# Patient Record
Sex: Female | Born: 1980 | Race: White | Hispanic: No | Marital: Married | Smoking: Never smoker
Health system: Southern US, Community
[De-identification: ages and names within clinical notes are randomized; demographics above are authoritative.]

## PROBLEM LIST (undated history)

## (undated) DIAGNOSIS — F329 Major depressive disorder, single episode, unspecified: Secondary | ICD-10-CM

## (undated) DIAGNOSIS — F419 Anxiety disorder, unspecified: Secondary | ICD-10-CM

## (undated) DIAGNOSIS — K219 Gastro-esophageal reflux disease without esophagitis: Secondary | ICD-10-CM

## (undated) DIAGNOSIS — F32A Depression, unspecified: Secondary | ICD-10-CM

## (undated) DIAGNOSIS — I10 Essential (primary) hypertension: Secondary | ICD-10-CM

## (undated) HISTORY — DX: Essential (primary) hypertension: I10

## (undated) HISTORY — PX: GASTRIC BYPASS: SHX52

## (undated) HISTORY — PX: APPENDECTOMY: SHX54

## (undated) HISTORY — DX: Depression, unspecified: F32.A

---
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
## 2009-06-08 ENCOUNTER — Inpatient Hospital Stay: Payer: Self-pay | Admitting: Vascular Surgery

## 2014-03-01 ENCOUNTER — Encounter: Payer: Self-pay | Admitting: Family Medicine

## 2014-03-01 ENCOUNTER — Ambulatory Visit (INDEPENDENT_AMBULATORY_CARE_PROVIDER_SITE_OTHER): Payer: No Typology Code available for payment source | Admitting: Family Medicine

## 2014-03-01 VITALS — BP 154/96 | HR 95 | Temp 98.5°F | Resp 18 | Ht 67.5 in | Wt 321.8 lb

## 2014-03-01 DIAGNOSIS — R030 Elevated blood-pressure reading, without diagnosis of hypertension: Secondary | ICD-10-CM

## 2014-03-01 DIAGNOSIS — R5381 Other malaise: Secondary | ICD-10-CM

## 2014-03-01 DIAGNOSIS — R635 Abnormal weight gain: Secondary | ICD-10-CM

## 2014-03-01 DIAGNOSIS — E669 Obesity, unspecified: Secondary | ICD-10-CM

## 2014-03-01 DIAGNOSIS — R5383 Other fatigue: Principal | ICD-10-CM

## 2014-03-01 LAB — LIPID PANEL
Cholesterol: 168 mg/dL (ref 0–200)
HDL: 46 mg/dL (ref >39)
LDL Cholesterol: 98 mg/dL (ref 0–99)
Total CHOL/HDL Ratio: 3.7 Ratio
Triglycerides: 119 mg/dL (ref <150)
VLDL: 24 mg/dL (ref 0–40)

## 2014-03-01 LAB — COMPLETE METABOLIC PANEL WITH GFR
ALK PHOS: 107 U/L (ref 39–117)
ALT: 24 U/L (ref 0–35)
AST: 21 U/L (ref 0–37)
Albumin: 4 g/dL (ref 3.5–5.2)
BILIRUBIN TOTAL: 0.4 mg/dL (ref 0.2–1.2)
BUN: 12 mg/dL (ref 6–23)
CO2: 25 meq/L (ref 19–32)
CREATININE: 0.7 mg/dL (ref 0.50–1.10)
Calcium: 8.8 mg/dL (ref 8.4–10.5)
Chloride: 105 mEq/L (ref 96–112)
GFR, Est African American: >89 mL/min
GFR, Est Non African American: >89 mL/min
Glucose, Bld: 95 mg/dL (ref 70–99)
Potassium: 4.4 mEq/L (ref 3.5–5.3)
Sodium: 139 mEq/L (ref 135–145)
Total Protein: 7.1 g/dL (ref 6.0–8.3)

## 2014-03-01 LAB — POCT GLYCOSYLATED HEMOGLOBIN (HGB A1C): Hemoglobin A1C: 5

## 2014-03-01 LAB — CBC WITH DIFFERENTIAL/PLATELET
Basophils Absolute: 0 10*3/uL (ref 0.0–0.1)
Basophils Relative: 0 % (ref 0–1)
EOS ABS: 0.1 10*3/uL (ref 0.0–0.7)
Eosinophils Relative: 1 % (ref 0–5)
HCT: 42.3 % (ref 36.0–46.0)
Hemoglobin: 14.6 g/dL (ref 12.0–15.0)
LYMPHS ABS: 1.8 10*3/uL (ref 0.7–4.0)
LYMPHS PCT: 23 % (ref 12–46)
MCH: 30.5 pg (ref 26.0–34.0)
MCHC: 34.5 g/dL (ref 30.0–36.0)
MCV: 88.3 fL (ref 78.0–100.0)
Monocytes Absolute: 0.8 10*3/uL (ref 0.1–1.0)
Monocytes Relative: 10 % (ref 3–12)
Neutro Abs: 5.3 10*3/uL (ref 1.7–7.7)
Neutrophils Relative %: 66 % (ref 43–77)
PLATELETS: 435 10*3/uL — AB (ref 150–400)
RBC: 4.79 MIL/uL (ref 3.87–5.11)
RDW: 13.1 % (ref 11.5–15.5)
WBC: 8 10*3/uL (ref 4.0–10.5)

## 2014-03-01 LAB — GLUCOSE, POCT (MANUAL RESULT ENTRY): POC GLUCOSE: 98 mg/dL (ref 70–99)

## 2014-03-01 NOTE — Patient Instructions (Signed)
Fatigue Fatigue is a feeling of tiredness, lack of energy, lack of motivation, or feeling tired all the time. Having enough rest, good nutrition, and reducing stress will normally reduce fatigue. Consult your caregiver if it persists. The nature of your fatigue will help your caregiver to find out its cause. The treatment is based on the cause.  CAUSES  There are many causes for fatigue. Most of the time, fatigue can be traced to one or more of your habits or routines. Most causes fit into one or more of three general areas. They are: Lifestyle problems  Sleep disturbances.  Overwork.  Physical exertion.  Unhealthy habits.  Poor eating habits or eating disorders.  Alcohol and/or drug use .  Lack of proper nutrition (malnutrition). Psychological problems  Stress and/or anxiety problems.  Depression.  Grief.  Boredom. Medical Problems or Conditions  Anemia.  Pregnancy.  Thyroid gland problems.  Recovery from major surgery.  Continuous pain.  Emphysema or asthma that is not well controlled  Allergic conditions.  Diabetes.  Infections (such as mononucleosis).  Obesity.  Sleep disorders, such as sleep apnea.  Heart failure or other heart-related problems.  Cancer.  Kidney disease.  Liver disease.  Effects of certain medicines such as antihistamines, cough and cold remedies, prescription pain medicines, heart and blood pressure medicines, drugs used for treatment of cancer, and some antidepressants. SYMPTOMS  The symptoms of fatigue include:   Lack of energy.  Lack of drive (motivation).  Drowsiness.  Feeling of indifference to the surroundings. DIAGNOSIS  The details of how you feel help guide your caregiver in finding out what is causing the fatigue. You will be asked about your present and past health condition. It is important to review all medicines that you take, including prescription and non-prescription items. A thorough exam will be done.  You will be questioned about your feelings, habits, and normal lifestyle. Your caregiver may suggest blood tests, urine tests, or other tests to look for common medical causes of fatigue.  TREATMENT  Fatigue is treated by correcting the underlying cause. For example, if you have continuous pain or depression, treating these causes will improve how you feel. Similarly, adjusting the dose of certain medicines will help in reducing fatigue.  HOME CARE INSTRUCTIONS   Try to get the required amount of good sleep every night.  Eat a healthy and nutritious diet, and drink enough water throughout the day.  Practice ways of relaxing (including yoga or meditation).  Exercise regularly.  Make plans to change situations that cause stress. Act on those plans so that stresses decrease over time. Keep your work and personal routine reasonable.  Avoid street drugs and minimize use of alcohol.  Start taking a daily multivitamin after consulting your caregiver. SEEK MEDICAL CARE IF:   You have persistent tiredness, which cannot be accounted for.  You have fever.  You have unintentional weight loss.  You have headaches.  You have disturbed sleep throughout the night.  You are feeling sad.  You have constipation.  You have dry skin.  You have gained weight.  You are taking any new or different medicines that you suspect are causing fatigue.  You are unable to sleep at night.  You develop any unusual swelling of your legs or other parts of your body. SEEK IMMEDIATE MEDICAL CARE IF:   You are feeling confused.  Your vision is blurred.  You feel faint or pass out.  You develop severe headache.  You develop severe abdominal, pelvic, or   back pain.  You develop chest pain, shortness of breath, or an irregular or fast heartbeat.  You are unable to pass a normal amount of urine.  You develop abnormal bleeding such as bleeding from the rectum or you vomit blood.  You have thoughts  about harming yourself or committing suicide.  You are worried that you might harm someone else. MAKE SURE YOU:   Understand these instructions.  Will watch your condition.  Will get help right away if you are not doing well or get worse. Document Released: 09/21/2007 Document Revised: 02/16/2012 Document Reviewed: 09/21/2007 [facility] Patient Information 2014 [place].    Vitamin D Deficiency Vitamin D is an important vitamin that your body needs. Having too little of it in your body is called a deficiency. A very bad deficiency can make your bones soft and can cause a condition called rickets.  Vitamin D is important to your body for different reasons, such as:   It helps your body absorb 2 minerals called calcium and phosphorus.  It helps make your bones healthy.  It may prevent some diseases, such as diabetes and multiple sclerosis.  It helps your muscles and heart. You can get vitamin D in several ways. It is a natural part of some foods. The vitamin is also added to some dairy products and cereals. Some people take vitamin D supplements. Also, your body makes vitamin D when you are in the sun. It changes the sun's rays into a form of the vitamin that your body can use. CAUSES   Not eating enough foods that contain vitamin D.  Not getting enough sunlight.  Having certain digestive system diseases that make it hard to absorb vitamin D. These diseases include Crohn's disease, chronic pancreatitis, and cystic fibrosis.  Having a surgery in which part of the stomach or small intestine is removed.  Being obese. Fat cells pull vitamin D out of your blood. That means that obese people may not have enough vitamin D left in their blood and in other body tissues.  Having chronic kidney or liver disease. RISK FACTORS Risk factors are things that make you more likely to develop a vitamin D deficiency. They include:  Being older.  Not being able to get outside very  much.  Living in a nursing home.  Having had broken bones.  Having weak or thin bones (osteoporosis).  Having a disease or condition that changes how your body absorbs vitamin D.  Having dark skin.  Some medicines such as seizure medicines or steroids.  Being overweight or obese. SYMPTOMS Mild cases of vitamin D deficiency may not have any symptoms. If you have a very bad case, symptoms may include:  Bone pain.  Muscle pain.  Falling often.  Broken bones caused by a minor injury, due to osteoporosis. DIAGNOSIS A blood test is the best way to tell if you have a vitamin D deficiency. TREATMENT Vitamin D deficiency can be treated in different ways. Treatment for vitamin D deficiency depends on what is causing it. Options include:  Taking vitamin D supplements.  Taking a calcium supplement. Your caregiver will suggest what dose is best for you. HOME CARE INSTRUCTIONS  Take any supplements that your caregiver prescribes. Follow the directions carefully. Take only the suggested amount.  Have your blood tested 2 months after you start taking supplements.  Eat foods that contain vitamin D. Healthy choices include:  Fortified dairy products, cereals, or juices. Fortified means vitamin D has been added to the food.  Check the label on the package to be sure.  Fatty fish like salmon or trout.  Eggs.  Oysters.  Do not use a tanning bed.  Keep your weight at a healthy level. Lose weight if you need to.  Keep all follow-up appointments. Your caregiver will need to perform blood tests to make sure your vitamin D deficiency is going away. SEEK MEDICAL CARE IF:  You have any questions about your treatment.  You continue to have symptoms of vitamin D deficiency.  You have nausea or vomiting.  You are constipated.  You feel confused.  You have severe abdominal or back pain. MAKE SURE YOU:  Understand these instructions.  Will watch your condition.  Will get help  right away if you are not doing well or get worse. Document Released: 02/16/2012 Document Revised: 03/21/2013 Document Reviewed: 02/16/2012 [facility] Patient Information 2014 [place].

## 2014-03-02 DIAGNOSIS — R030 Elevated blood-pressure reading, without diagnosis of hypertension: Secondary | ICD-10-CM | POA: Insufficient documentation

## 2014-03-02 DIAGNOSIS — E669 Obesity, unspecified: Secondary | ICD-10-CM | POA: Insufficient documentation

## 2014-03-02 DIAGNOSIS — R5383 Other fatigue: Principal | ICD-10-CM

## 2014-03-02 DIAGNOSIS — R5381 Other malaise: Secondary | ICD-10-CM | POA: Insufficient documentation

## 2014-03-02 LAB — THYROID PANEL WITH TSH
Free Thyroxine Index: 3.2 (ref 1.0–3.9)
T3 Uptake: 36.7 % (ref 22.5–37.0)
T4 TOTAL: 8.7 ug/dL (ref 5.0–12.5)
TSH: 1.731 u[IU]/mL (ref 0.350–4.500)

## 2014-03-02 LAB — ANA: Anti Nuclear Antibody(ANA): NEGATIVE

## 2014-03-02 LAB — VITAMIN D 25 HYDROXY (VIT D DEFICIENCY, FRACTURES): VIT D 25 HYDROXY: 19 ng/mL — AB (ref 30–89)

## 2014-03-02 NOTE — Progress Notes (Signed)
Subjective:    Patient ID: Alexandra Petty, female    DOB: 08-04-1981, 33 y.o.   MRN: [id number]  Rash Associated symptoms include shortness of breath. Pertinent negatives include no cough, fatigue or fever.    This 33 y.o. Cauc female is new to [facility], having received previous medical care in [place], [place]. She reports no chronic medical issues.  Her concerns today include weight gain (100 lbs in 18 months), fatigue x 6 months with occasional SOB/ DOE (stair climbing and walking > 1 mile) and muscle fatigue, onset in late evening. She has a facial rash w/ raised bumps in cheek and flushing in neck area x 1 year. Skin used to oily now it is very sensitive. She sleeps well and does not snore. She has had BP elevations in the past but no treatment for HTN.  PMHx, Surg Hx, Soc and Fam Hx reviewed.  Review of Systems  Constitutional: Positive for activity change and unexpected weight change. Negative for fever, diaphoresis, appetite change and fatigue.  HENT: Negative.   Eyes: Negative.   Respiratory: Positive for shortness of breath. Negative for cough, chest tightness and wheezing.   Cardiovascular: Negative.   Gastrointestinal: Negative.   Musculoskeletal: Positive for myalgias. Negative for arthralgias, gait problem and joint swelling.  Skin: Positive for color change and rash.  Neurological: Negative.   Hematological: Negative.   Psychiatric/Behavioral: Negative.       Objective:   Physical Exam  Nursing note and vitals reviewed. Constitutional: She is oriented to person, place, and time. She appears well-developed and well-nourished. No distress.  HENT:  Head: Normocephalic and atraumatic.  Right Ear: Hearing, tympanic membrane, external ear and ear canal normal.  Left Ear: Hearing, tympanic membrane, external ear and ear canal normal.  Nose: Nose normal. No nasal deformity or septal deviation.  Mouth/Throat: Uvula is midline, oropharynx is clear and moist and mucous membranes  are normal. No oral lesions. Normal dentition. No dental caries.  Eyes: Conjunctivae, EOM and lids are normal. Pupils are equal, round, and reactive to light. No scleral icterus.  Fundoscopic exam:      The right eye shows no hemorrhage and no papilledema. The right eye shows red reflex.       The left eye shows no hemorrhage and no papilledema. The left eye shows red reflex.  Neck: Trachea normal, normal range of motion and full passive range of motion without pain. Neck supple. No JVD present. No spinous process tenderness and no muscular tenderness present. Thyromegaly present. No mass present.  Cardiovascular: Normal rate, regular rhythm, S1 normal, S2 normal, normal heart sounds and normal pulses.   No extrasystoles are present. PMI is not displaced.  Exam reveals no gallop and no friction rub.   No murmur heard. Pulmonary/Chest: Effort normal and breath sounds normal. No respiratory distress.  Abdominal: Soft. Normal appearance and bowel sounds are normal. She exhibits no distension and no mass. There is no hepatosplenomegaly. There is no tenderness. There is no guarding and no CVA tenderness.  Increased abdominal girth makes assessment difficult.  Musculoskeletal: Normal range of motion. She exhibits no edema and no tenderness.  Lymphadenopathy:       Head (right side): No submental, no submandibular, no tonsillar, no posterior auricular and no occipital adenopathy present.       Head (left side): No submental, no submandibular, no tonsillar, no posterior auricular and no occipital adenopathy present.    She has no cervical adenopathy.  Right: No supraclavicular adenopathy present.       Left: No supraclavicular adenopathy present.  Neurological: She is alert and oriented to person, place, and time. She has normal strength and normal reflexes. She displays no atrophy. No cranial nerve deficit or sensory deficit. She exhibits normal muscle tone. Coordination and gait normal.  Skin: Skin  is warm, dry and intact. No bruising and no lesion noted. She is not diaphoretic. There is erythema. No cyanosis. No pallor. Nails show no clubbing.  Facial area- papulopustular rash w/ erythema.  Psychiatric: She has a normal mood and affect. Her speech is normal and behavior is normal. Judgment and thought content normal. Cognition and memory are normal.   A1c= 5.0%    Assessment & Plan:  Other malaise and fatigue - Plan: COMPLETE METABOLIC PANEL WITH GFR, CBC with Differential, Vitamin D, 25-hydroxy, Thyroid Panel With TSH, ANA  Obesity, unspecified - Plan: COMPLETE METABOLIC PANEL WITH GFR, POCT glucose (manual entry), POCT glycosylated hemoglobin (Hb A1C), Lipid panel  Weight gain - Plan: COMPLETE METABOLIC PANEL WITH GFR, Thyroid Panel With TSH  Elevated blood pressure reading without diagnosis of hypertension

## 2014-03-03 ENCOUNTER — Other Ambulatory Visit: Payer: Self-pay | Admitting: Family Medicine

## 2014-03-03 ENCOUNTER — Encounter: Payer: Self-pay | Admitting: Family Medicine

## 2014-03-03 MED ORDER — VITAMIN D (ERGOCALCIFEROL) 1.25 MG (50000 UNIT) PO CAPS: 50000.0000 [IU] | ORAL_CAPSULE | ORAL | Status: DC

## 2014-03-16 ENCOUNTER — Ambulatory Visit: Payer: No Typology Code available for payment source | Admitting: Family Medicine

## 2014-03-21 ENCOUNTER — Encounter: Payer: Self-pay | Admitting: Family Medicine

## 2014-03-21 ENCOUNTER — Ambulatory Visit (INDEPENDENT_AMBULATORY_CARE_PROVIDER_SITE_OTHER): Payer: No Typology Code available for payment source | Admitting: Family Medicine

## 2014-03-21 VITALS — BP 134/86 | HR 103 | Temp 98.7°F | Resp 16 | Ht 67.5 in | Wt 325.8 lb

## 2014-03-21 DIAGNOSIS — E559 Vitamin D deficiency, unspecified: Secondary | ICD-10-CM

## 2014-03-21 DIAGNOSIS — E639 Nutritional deficiency, unspecified: Secondary | ICD-10-CM

## 2014-03-21 NOTE — Progress Notes (Signed)
S:  This 33 y.o. Cauc female is here for follow-up; she was having fatigue and paresthesias in hands (she describes the sensation as "tremors"). Since starting Vitamin D, the "tremors" have lessened. Overall, she feels better. Her heart rate is above normal due to cafeeine consumption (drinks > 20 ounces of coffee daily).  Patient Active Problem List   Diagnosis Date Noted  . Other malaise and fatigue 03/02/2014  . Obesity, unspecified 03/02/2014  . Elevated blood pressure reading without diagnosis of hypertension 03/02/2014   Prior to Admission medications   Medication Sig Start Date End Date Taking? Authorizing Provider  Vitamin D, Ergocalciferol, (DRISDOL) 50000 UNITS CAPS capsule Take 1 capsule (50,000 Units total) by mouth every 7 (seven) days. 03/03/14  Yes Barton Fanny, MD   PMHx, Soc and Fam Hx reviewed.  ROS: Noncontributory.  O: Filed Vitals:   03/21/14 1439  BP: 134/86  Pulse: 103  Temp: 98.7 F (37.1 C)  Resp: 16   GEN: In NAD; WN,WD. HENT: [place]/AT; otherwise unremarkable. COR: Tachycardic. LUNGS: Unlabored resp. SKIN: W&D; intact with mild facial erythema. NEURO: A&O x 3; CNs intact. Nonfocal.  A/P: Unspecified vitamin D deficiency- Continue once-a-week supplement and Vitamin D rich foods. Pt advised about sun exposure (10 minutes w/o sun block 3-4 days a week).  Recheck Vitamin D level in 3 months.

## 2014-07-05 ENCOUNTER — Ambulatory Visit: Payer: No Typology Code available for payment source | Admitting: Family Medicine

## 2014-07-05 ENCOUNTER — Encounter: Payer: Self-pay | Admitting: Family Medicine

## 2014-07-05 ENCOUNTER — Ambulatory Visit (INDEPENDENT_AMBULATORY_CARE_PROVIDER_SITE_OTHER): Payer: No Typology Code available for payment source | Admitting: Family Medicine

## 2014-07-05 VITALS — BP 149/93 | HR 79 | Temp 98.3°F | Resp 16 | Ht 67.5 in | Wt 320.8 lb

## 2014-07-05 DIAGNOSIS — F3281 Premenstrual dysphoric disorder: Secondary | ICD-10-CM | POA: Insufficient documentation

## 2014-07-05 DIAGNOSIS — E559 Vitamin D deficiency, unspecified: Secondary | ICD-10-CM

## 2014-07-05 DIAGNOSIS — Z1231 Encounter for screening mammogram for malignant neoplasm of breast: Secondary | ICD-10-CM

## 2014-07-05 DIAGNOSIS — N943 Premenstrual tension syndrome: Secondary | ICD-10-CM

## 2014-07-05 MED ORDER — PAROXETINE HCL ER 12.5 MG PO TB24: 12.5000 mg | ORAL_TABLET | Freq: Every day | ORAL | Status: DC

## 2014-07-05 NOTE — Patient Instructions (Signed)
You have received some information about PMDD (pre-menstrual mood disorder). The medication for treatment is Paxil (paroxetine); initial dose is 12.5 mg 1 tablet daily. This dose may need to be increased when you return for physical and PAP.   Your mammogram has been ordered and you will be called with the details of your appointment. Do not wear deodorant, body powder, lotion or any other products around the breast, upper chest and armpit areas.

## 2014-07-05 NOTE — Progress Notes (Signed)
S:  This 33 y.o. Cauc female is here for recheck of Vitamin D level (= 19 in March). She does feel better in the last few months. She tolerates the Vitamin D supplement w/o adverse effects. Complete physical and PAP need to be scheduled; last exam > 10 years.   Pt does report dysmorphic mood disorder in the days before menses. She notices that she feels down and experiences anhedonia during the days preceding her menses. This has some mildly negative effects on her marriage. She also reports increased anxiety. There have been no socioeconomic impacts such that she cannot go to work or has experienced any negative impact at work. She is interested in medication to reduce mood swings. Contraception in the past - DepoProvera which caused weight gain.  Pt has desire to have mammogram; her mother had abnormal screenings and "some cancerous cells were found" on one occasion. Pt reports mild breast discomfort but caffeine reduction eliminates pain.  Patient Active Problem List   Diagnosis Date Noted  . Vitamin D deficiency 03/21/2014  . Other malaise and fatigue 03/02/2014  . Obesity, unspecified 03/02/2014  . Elevated blood pressure reading without diagnosis of hypertension 03/02/2014    Outpatient Encounter Prescriptions as of 07/05/2014  Medication Sig  . Vitamin D, Ergocalciferol, (DRISDOL) 50000 UNITS CAPS capsule Take 1 capsule (50,000 Units total) by mouth every 7 (seven) days.    No Known Allergies  PMHx, Surg Hx,Soc and Fam Hx reviewed.  O: Filed Vitals:   07/05/14 1024  BP: 149/93  Pulse: 79  Temp: 98.3 F (36.8 C)  Resp: 16   GEN: In NAD; WN,WD.  Weight down 5 lbs. HENT: /AT; EOMI w/ clear conj/sclerae. Otherwise unremarkable. COR: RRR. LUNGS: Unlabored resp. SKIN: W&D; intact w/o pallor. Pt is a redhead and has very fair complexion. NEURO: A&O x 3; CNs intact. Nonfocal.  A/P: Unspecified vitamin D deficiency - Plan: Vitamin D, 25-hydroxy  Pre-menstrual mood disorder-  Pt given information printed from Up to date; she is interested in trial of SSRI. RX: Paroxetine CR 12.5 mg 1 tablet every morning. Advised that dose may need to be increased but this can be discussed at her CPE/PAP visit. Also recommended ongoing healthy lifestyle modifications for stress reduction and weight loss.  Other screening mammogram - Plan: MM Digital Screening  Meds ordered this encounter  Medications  . PARoxetine (PAXIL-CR) 12.5 MG 24 hr tablet    Sig: Take 1 tablet (12.5 mg total) by mouth daily.    Dispense:  30 tablet    Refill:  2

## 2014-07-06 LAB — VITAMIN D 25 HYDROXY (VIT D DEFICIENCY, FRACTURES): Vit D, 25-Hydroxy: 31 ng/mL (ref 30–89)

## 2014-08-02 ENCOUNTER — Encounter: Payer: Self-pay | Admitting: Family Medicine

## 2014-08-15 ENCOUNTER — Encounter: Payer: Self-pay | Admitting: Family Medicine

## 2014-09-13 ENCOUNTER — Encounter: Payer: Self-pay | Admitting: Family Medicine

## 2014-09-13 ENCOUNTER — Ambulatory Visit (INDEPENDENT_AMBULATORY_CARE_PROVIDER_SITE_OTHER): Payer: No Typology Code available for payment source | Admitting: Family Medicine

## 2014-09-13 VITALS — BP 150/98 | HR 93 | Temp 98.3°F | Resp 16 | Ht 68.0 in | Wt 322.0 lb

## 2014-09-13 DIAGNOSIS — R030 Elevated blood-pressure reading, without diagnosis of hypertension: Secondary | ICD-10-CM

## 2014-09-13 DIAGNOSIS — Z23 Encounter for immunization: Secondary | ICD-10-CM

## 2014-09-13 DIAGNOSIS — Z01419 Encounter for gynecological examination (general) (routine) without abnormal findings: Secondary | ICD-10-CM

## 2014-09-13 DIAGNOSIS — E559 Vitamin D deficiency, unspecified: Secondary | ICD-10-CM

## 2014-09-13 DIAGNOSIS — F3281 Premenstrual dysphoric disorder: Secondary | ICD-10-CM

## 2014-09-13 DIAGNOSIS — N943 Premenstrual tension syndrome: Secondary | ICD-10-CM

## 2014-09-13 DIAGNOSIS — Z789 Other specified health status: Secondary | ICD-10-CM

## 2014-09-13 DIAGNOSIS — Z309 Encounter for contraceptive management, unspecified: Secondary | ICD-10-CM

## 2014-09-13 MED ORDER — CITALOPRAM HYDROBROMIDE 20 MG PO TABS: 20.0000 mg | ORAL_TABLET | Freq: Every day | ORAL | Status: DC

## 2014-09-13 NOTE — Progress Notes (Signed)
Subjective:    Patient ID: Alexandra Petty, female    DOB: 17-May-1981, 33 y.o.   MRN: [id number]  HPI  This 33 y.o. Cauc female is here for CPE w/ PAP and pelvic. She desires OCP to regulate menses; hx is significant for pre-menstrual mood disorder, treated w/ Paxil.  Pt discontinued this med due to increased appetite in the face of efforts at weight loss.  Weight gain dates back to 2008. She recently attended a seminar re: bariatric surgery.  Pt has tried the following weight loss plans: [employer], [employer] (successful weight reduction), Weight Watchers and Slim Fast. She reports regular exercise.  HCM: MM- Current (07/2014) w/ abnormality in R breast; recall in 6 months.           PAP- > 3 years (abnormal Atypical? w/ normal repeat).  Patient Active Problem List   Diagnosis Date Noted  . Pre-menstrual mood disorder 07/05/2014  . Vitamin D deficiency 03/21/2014  . Other malaise and fatigue 03/02/2014  . Obesity, unspecified 03/02/2014  . Elevated blood pressure reading without diagnosis of hypertension 03/02/2014    Prior to Admission medications   Medication Sig Start Date End Date Taking? Authorizing Provider  Vitamin D, Ergocalciferol, (DRISDOL) 50000 UNITS CAPS capsule Take 1 capsule (50,000 Units total) by mouth every 7 (seven) days. 03/03/14  Yes Barton Fanny, MD  PARoxetine (PAXIL-CR) 12.5 MG 24 hr tablet Take 1 tablet (12.5 mg total) by mouth daily. 07/05/14   Barton Fanny, MD    History   Social History  . Marital Status: Married    Spouse Name: N/A    Number of Children: N/A  . Years of Education: 12+   Occupational History  . Accountant    Social History Main Topics  . Smoking status: Never Smoker   . Smokeless tobacco: Not on file  . Alcohol Use: 0.6 oz/week    1 Glasses of wine per week  . Drug Use: No  . Sexual Activity: Yes    Birth Control/ Protection: Condom   Other Topics Concern  . Not on file   Social History Narrative  . No  narrative on file    Family History  Problem Relation Age of Onset  . Arthritis Mother   . Alcohol abuse Father   . Diabetes Father   . Heart disease Father   . Hyperlipidemia Father   . Hypertension Father     Review of Systems  Constitutional: Negative.   HENT: Negative.   Eyes: Negative.   Respiratory: Negative.   Cardiovascular: Negative.   Gastrointestinal: Negative.   Genitourinary: Negative.   Musculoskeletal: Negative.   Skin: Negative.   Allergic/Immunologic: Positive for environmental allergies.  Neurological: Negative.   Hematological: Negative.   Psychiatric/Behavioral: Negative.       Objective:   Physical Exam  Nursing note and vitals reviewed. Constitutional: She is oriented to person, place, and time. She appears well-developed and well-nourished. No distress.  HENT:  Head: Normocephalic and atraumatic.  Right Ear: Hearing and external ear normal.  Left Ear: Hearing and external ear normal.  Nose: Nose normal. No nasal deformity or septal deviation.  Mouth/Throat: Mucous membranes are normal. No oral lesions. Normal dentition.  Eyes: Conjunctivae and EOM are normal. Pupils are equal, round, and reactive to light. No scleral icterus.  Neck: Normal range of motion. Neck supple. No thyromegaly present.  Cardiovascular: Normal rate, regular rhythm, S1 normal, S2 normal, normal heart sounds and normal pulses.  Exam reveals no gallop.  No murmur heard. Pulmonary/Chest: Effort normal and breath sounds normal. No respiratory distress. She has no decreased breath sounds.  Genitourinary: Uterus normal. There is no rash, tenderness or lesion on the right labia. There is no rash, tenderness or lesion on the left labia. Cervix exhibits no motion tenderness, no discharge and no friability. Right adnexum displays no mass, no tenderness and no fullness. Left adnexum displays no mass, no tenderness and no fullness. There is erythema around the vagina. No tenderness or  bleeding around the vagina. No signs of injury around the vagina. No vaginal discharge found.  Difficult to palpate pelvic organs due to adipose tissue.  Musculoskeletal: Normal range of motion. She exhibits no edema and no tenderness.  Lymphadenopathy:    She has no cervical adenopathy.  Neurological: She is alert and oriented to person, place, and time. No cranial nerve deficit. She exhibits normal muscle tone. Coordination normal.  Skin: Skin is warm and dry. No rash noted. She is not diaphoretic. No pallor.  Psychiatric: She has a normal mood and affect. Her behavior is normal. Judgment and thought content normal.       Assessment & Plan:  Encounter for physical examination, contraception, and Papanicolaou smear of cervix - Plan: Pap IG, CT/NG w/ reflex HPV when ASC-U  Pre-menstrual mood disorder- Pt is willing to try a different SSRI; trial Citalopram 20 mg 1 tablet daily. Will not prescribe OCPs until breast mass is fully evaluated. Explained this to pt and she voices understanding.  Vitamin D deficiency - Continue supplement.  Plan: Vitamin D, 25-hydroxy  Elevated blood pressure reading without diagnosis of hypertension- Conitnue to monitor.  Obesity, morbid- I have endorsed bariatric surgery for this young woman to reduce her risk of chronic medical illnesses, including cancer.  No immunization history record - Plan: Hepatitis B surface antibody   Meds ordered this encounter  Medications  . citalopram (CELEXA) 20 MG tablet    Sig: Take 1 tablet (20 mg total) by mouth daily.    Dispense:  30 tablet    Refill:  3

## 2014-09-13 NOTE — Patient Instructions (Addendum)
Keeping You Healthy  Get These Tests 1. Blood Pressure- Have your blood pressure checked once a year by your health care provider.  Normal blood pressure is 120/80. 2. Weight- Have your body mass index (BMI) calculated to screen for obesity.  BMI is measure of body fat based on height and weight.  You can also calculate your own BMI at GravelBags.it. 3. Cholesterol- Have your cholesterol checked every 5 years starting at age 35 then yearly starting at age 17. 52. Chlamydia, HIV, and other sexually transmitted diseases- Get screened every year until age 18, then within three months of each new sexual provider. 5. Pap Smear- Every 1-3 years; discuss with your health care provider. 6. Mammogram- Every year starting at age 63  Take these medicines  Calcium with Vitamin D-Your body needs 1200 mg of Calcium each day and [phone] IU of Vitamin D daily.  Your body can only absorb 500 mg of Calcium at a time so Calcium must be taken in 2 or 3 divided doses throughout the day.  Multivitamin with folic acid- Once daily if it is possible for you to become pregnant.  Get these Immunizations  Tetanus shot- Every 10 years.  Flu shot-Every year.  Hepatitis screen- A blood test is being drawn today to check your immunity against Hepatitis B. There is also a vaccine for Hepatitis A for those persons in high risk groups.  Take these steps 1. Do not smoke-Your healthcare provider can help you quit.  For tips on how to quit go to www.smokefree.gov or call 1-800 QUITNOW. 2. Be physically active- Exercise 5 days a week for at least 30 minutes.  If you are not already physically active, start slow and gradually work up to 30 minutes of moderate physical activity.  Examples of moderate activity include walking briskly, dancing, swimming, bicycling, etc. 3. Breast Cancer- A self breast exam every month is important for early detection of breast cancer.  For more information and instruction on self breast  exams, ask your healthcare provider or https://www.patel.info/. 4. Eat a healthy diet- Eat a variety of healthy foods such as fruits, vegetables, whole grains, low fat milk, low fat cheeses, yogurt, lean meats, poultry and fish, beans, nuts, tofu, etc.  For more information go to www. Thenutritionsource.org 5. Drink alcohol in moderation- Limit alcohol intake to one drink or less per day. Never drink and drive. 6. Depression- Your emotional health is as important as your physical health.  If you're feeling down or losing interest in things you normally enjoy please talk to your healthcare provider about being screened for depression. 7. Dental visit- Brush and floss your teeth twice daily; visit your dentist twice a year. 8. Eye doctor- Get an eye exam at least every 2 years. 9. Helmet use- Always wear a helmet when riding a bicycle, motorcycle, rollerblading or skateboarding. 10. Safe sex- If you may be exposed to sexually transmitted infections, use a condom. 11. Seat belts- Seat belts can save your live; always wear one. 12. Smoke/Carbon Monoxide detectors- These detectors need to be installed on the appropriate level of your home. Replace batteries at least once a year. 13. Skin cancer- When out in the sun please cover up and use sunscreen 15 SPF or higher. 14. Violence- If anyone is threatening or hurting you, please tell your healthcare provider.        Mediterranean Diet  Why follow it? Research shows.   Those who follow the Mediterranean diet have a reduced risk of heart  disease    The diet is associated with a reduced incidence of Parkinson's and Alzheimer's diseases   People following the diet may have longer life expectancies and lower rates of chronic diseases    The Dietary Guidelines for Americans recommends the Mediterranean diet as an eating plan to promote health and prevent disease  What Is the Mediterranean Diet?    Healthy eating plan based on typical  foods and recipes of Mediterranean-style cooking   The diet is primarily a plant based diet; these foods should make up a majority of meals   Starches - Plant based foods should make up a majority of meals - They are an important sources of vitamins, minerals, energy, antioxidants, and fiber - Choose whole grains, foods high in fiber and minimally processed items  - Typical grain sources include wheat, oats, barley, corn, brown rice, bulgar, farro, millet, polenta, couscous  - Various types of beans include chickpeas, lentils, fava beans, black beans, white beans   Fruits  Veggies - Large quantities of antioxidant rich fruits & veggies; 6 or more servings  - Vegetables can be eaten raw or lightly drizzled with oil and cooked  - Vegetables common to the traditional Mediterranean Diet include: artichokes, arugula, beets, broccoli, brussel sprouts, cabbage, carrots, celery, collard greens, cucumbers, eggplant, kale, leeks, lemons, lettuce, mushrooms, okra, onions, peas, peppers, potatoes, pumpkin, radishes, rutabaga, shallots, spinach, sweet potatoes, turnips, zucchini - Fruits common to the Mediterranean Diet include: apples, apricots, avocados, cherries, clementines, dates, figs, grapefruits, grapes, melons, nectarines, oranges, peaches, pears, pomegranates, strawberries, tangerines  Fats - Replace butter and margarine with healthy oils, such as olive oil, canola oil, and tahini  - Limit nuts to no more than a handful a day  - Nuts include walnuts, almonds, pecans, pistachios, pine nuts  - Limit or avoid candied, honey roasted or heavily salted nuts - Olives are central to the [employer] - can be eaten whole or used in a variety of dishes   Meats Protein - Limiting red meat: no more than a few times a month - When eating red meat: choose lean cuts and keep the portion to the size of deck of cards - Eggs: approx. 0 to 4 times a week  - Fish and lean poultry: at least 2 a week  - Healthy  protein sources include, chicken, [place], lean beef, lamb - Increase intake of seafood such as tuna, salmon, trout, mackerel, shrimp, scallops - Avoid or limit high fat processed meats such as sausage and bacon  Dairy - Include moderate amounts of low fat dairy products  - Focus on healthy dairy such as fat free yogurt, skim milk, low or reduced fat cheese - Limit dairy products higher in fat such as whole or 2% milk, cheese, ice cream  Alcohol - Moderate amounts of red wine is ok  - No more than 5 oz daily for women (all ages) and men older than age 1  - No more than 10 oz of wine daily for men younger than 41  Other - Limit sweets and other desserts  - Use herbs and spices instead of salt to flavor foods  - Herbs and spices common to the traditional Mediterranean Diet include: basil, bay leaves, chives, cloves, cumin, fennel, garlic, lavender, marjoram, mint, oregano, parsley, pepper, rosemary, sage, savory, sumac, tarragon, thyme   It's not just a diet, it's a lifestyle:    The Mediterranean diet includes lifestyle factors typical of those in the region  Foods, drinks and meals are best eaten with others and savored   Daily physical activity is important for overall good health   This could be strenuous exercise like running and aerobics   This could also be more leisurely activities such as walking, housework, yard-work, or taking the stairs   Moderation is the key; a balanced and healthy diet accommodates most foods and drinks   Consider portion sizes and frequency of consumption of certain foods   Meal Ideas & Options:    Breakfast:  o Whole wheat toast or whole wheat English muffins with peanut butter & hard boiled egg o Steel cut oats topped with apples & cinnamon and skim milk  o Fresh fruit: banana, strawberries, melon, berries, peaches  o Smoothies: strawberries, bananas, greek yogurt, peanut butter o Low fat greek yogurt with blueberries and granola  o Egg white omelet  with spinach and mushrooms o Breakfast couscous: whole wheat couscous, apricots, skim milk, cranberries    Sandwiches:  o Hummus and grilled vegetables (peppers, zucchini, squash) on whole wheat bread   o Grilled chicken on whole wheat pita with lettuce, tomatoes, cucumbers or tzatziki  o Tuna salad on whole wheat bread: tuna salad made with greek yogurt, olives, red peppers, capers, green onions o Garlic rosemary lamb pita: lamb sauted with garlic, rosemary, salt & pepper; add lettuce, cucumber, greek yogurt to pita - flavor with lemon juice and black pepper    Seafood:  o Mediterranean grilled salmon, seasoned with garlic, basil, parsley, lemon juice and black pepper o Shrimp, lemon, and spinach whole-grain pasta salad made with low fat greek yogurt  o Seared scallops with lemon orzo  o Seared tuna steaks seasoned salt, pepper, coriander topped with tomato mixture of olives, tomatoes, olive oil, minced garlic, parsley, green onions and cappers    Meats:  o Herbed greek chicken salad with kalamata olives, cucumber, feta  o Red bell peppers stuffed with spinach, bulgur, lean ground beef (or lentils) & topped with feta   o Kebabs: skewers of chicken, tomatoes, onions, zucchini, squash  o [place] burgers: made with red onions, mint, dill, lemon juice, feta cheese topped with roasted red peppers   Vegetarian o Cucumber salad: cucumbers, artichoke hearts, celery, red onion, feta cheese, tossed in olive oil & lemon juice  o Hummus and whole grain pita points with a greek salad (lettuce, tomato, feta, olives, cucumbers, red onion) o Lentil soup with celery, carrots made with vegetable broth, garlic, salt and pepper  o Tabouli salad: parsley, bulgur, mint, scallions, cucumbers, tomato, radishes, lemon juice, olive oil, salt and pepper. o     For Pre-menstrual Mood Disorder- I have prescribed Citalopram 20 mg 1 tablet daily . The weight gain is not a common side effect; actually, decreased appetite  is listed as a side effect.

## 2014-09-14 LAB — VITAMIN D 25 HYDROXY (VIT D DEFICIENCY, FRACTURES): VIT D 25 HYDROXY: 30 ng/mL (ref 30–89)

## 2014-09-14 LAB — HEPATITIS B SURFACE ANTIBODY,QUALITATIVE: HEP B S AB: NEGATIVE

## 2014-09-15 LAB — PAP IG, CT-NG, RFX HPV ASCU
CHLAMYDIA PROBE AMP: NEGATIVE
GC Probe Amp: NEGATIVE

## 2014-09-18 ENCOUNTER — Encounter: Payer: Self-pay | Admitting: Family Medicine

## 2014-09-19 ENCOUNTER — Telehealth: Payer: Self-pay | Admitting: *Deleted

## 2014-09-19 ENCOUNTER — Encounter: Payer: Self-pay | Admitting: Family Medicine

## 2014-09-19 NOTE — Telephone Encounter (Signed)
Faxed letter to Dr Johnathan Hausen at [facility] per Dr Leward Quan. Confirmation page received at 2:40 pm.

## 2014-09-20 ENCOUNTER — Other Ambulatory Visit (INDEPENDENT_AMBULATORY_CARE_PROVIDER_SITE_OTHER): Payer: Self-pay

## 2014-09-20 DIAGNOSIS — Z1231 Encounter for screening mammogram for malignant neoplasm of breast: Secondary | ICD-10-CM

## 2014-10-18 ENCOUNTER — Other Ambulatory Visit: Payer: Self-pay

## 2014-10-18 ENCOUNTER — Ambulatory Visit (HOSPITAL_COMMUNITY)
Admission: RE | Admit: 2014-10-18 | Discharge: 2014-10-18 | Disposition: A | Discharge status: Home / Self Care | Payer: No Typology Code available for payment source | Source: Ambulatory Visit | Attending: Surgery | Admitting: Surgery

## 2014-10-18 DIAGNOSIS — K449 Diaphragmatic hernia without obstruction or gangrene: Secondary | ICD-10-CM | POA: Insufficient documentation

## 2014-10-18 DIAGNOSIS — Z01818 Encounter for other preprocedural examination: Secondary | ICD-10-CM | POA: Diagnosis not present

## 2014-10-21 ENCOUNTER — Encounter: Payer: Self-pay | Admitting: Dietician

## 2014-10-21 ENCOUNTER — Encounter: Payer: No Typology Code available for payment source | Attending: Surgery | Admitting: Dietician

## 2014-10-21 DIAGNOSIS — E669 Obesity, unspecified: Secondary | ICD-10-CM | POA: Insufficient documentation

## 2014-10-21 DIAGNOSIS — Z6841 Body Mass Index (BMI) 40.0 and over, adult: Secondary | ICD-10-CM | POA: Insufficient documentation

## 2014-10-21 DIAGNOSIS — Z713 Dietary counseling and surveillance: Secondary | ICD-10-CM | POA: Diagnosis not present

## 2014-10-21 NOTE — Progress Notes (Signed)
  Pre-Op Assessment Visit:  Pre-Operative RYGB Surgery  Medical Nutrition Therapy:  Appt start time: 1561   End time:  0845.  Patient was seen on 10/21/2014 for Pre-Operative RYGB Nutrition Assessment. Assessment and letter of approval faxed to [facility] Bariatric Surgery Program coordinator on 10/21/2014.   Preferred Learning Style:   No preference indicated   Learning Readiness:   Ready  Handouts given during visit include:  Pre-Op Goals Bariatric Surgery Protein Shakes  Teaching Method Utilized:  Visual Auditory  Barriers to learning/adherence to lifestyle change: none  Demonstrated degree of understanding via:  Teach Back   Patient to call the [facility] to enroll in Pre-Op and Post-Op Nutrition Education when surgery date is scheduled.

## 2014-10-30 ENCOUNTER — Ambulatory Visit (INDEPENDENT_AMBULATORY_CARE_PROVIDER_SITE_OTHER): Payer: No Typology Code available for payment source | Admitting: Family Medicine

## 2014-10-30 VITALS — BP 128/84 | HR 83 | Temp 97.8°F | Resp 16 | Ht 68.0 in | Wt 372.2 lb

## 2014-10-30 DIAGNOSIS — S0501XA Injury of conjunctiva and corneal abrasion without foreign body, right eye, initial encounter: Secondary | ICD-10-CM

## 2014-10-30 MED ORDER — MOXIFLOXACIN HCL (2X DAY) 0.5 % OP SOLN: 1.0000 [drp] | Freq: Two times a day (BID) | OPHTHALMIC | Status: DC

## 2014-10-30 NOTE — Progress Notes (Signed)
[facility] [address], [place] [address] [phone]  Date:  10/30/2014   Name:  Alexandra Petty   DOB:  11/07/1981   MRN:  [id number]  PCP:  Ellsworth Lennox, MD    Chief Complaint: Eye Pain   History of Present Illness:  Alexandra Petty is a 33 y.o. very pleasant female patient who presents with the following:  She noted onset of right eye irritation last night around 7pm- she thought it might be due to her contact.  She removed the lens and put them back in.  She took her contacts out before bed and noted that the right one seemed to stick to her eye a little bit.   This am she noted light sensitivity, and her right eye was red and irritated.   She is not aware of any FB or other injury She does not have any unusual hobbies that could cause eye injury.   Her vision is about like it usually is.  She does not notice any change from baseline   Patient Active Problem List   Diagnosis Date Noted  . Pre-menstrual mood disorder 07/05/2014  . Vitamin D deficiency 03/21/2014  . Other malaise and fatigue 03/02/2014  . Obesity, unspecified 03/02/2014  . Elevated blood pressure reading without diagnosis of hypertension 03/02/2014    History reviewed. No pertinent past medical history.  Past Surgical History  Procedure Laterality Date  . Appendectomy      History  Substance Use Topics  . Smoking status: Never Smoker   . Smokeless tobacco: Not on file  . Alcohol Use: 0.6 oz/week    1 Glasses of wine per week    Family History  Problem Relation Age of Onset  . Arthritis Mother   . Alcohol abuse Father   . Diabetes Father   . Heart disease Father   . Hyperlipidemia Father   . Hypertension Father     No Known Allergies  Medication list has been reviewed and updated.  Current Outpatient Prescriptions on File Prior to Visit  Medication Sig Dispense Refill  . citalopram (CELEXA) 20 MG tablet Take 1 tablet (20 mg total) by mouth daily. 30 tablet  3  . Vitamin D, Ergocalciferol, (DRISDOL) 50000 UNITS CAPS capsule Take 1 capsule (50,000 Units total) by mouth every 7 (seven) days. 10 capsule 2   No current facility-administered medications on file prior to visit.    Review of Systems:  As per HPI- otherwise negative.   Physical Examination: Filed Vitals:   10/30/14 1315  BP: 128/84  Pulse: 83  Temp: 97.8 F (36.6 C)  Resp: 16   Filed Vitals:   10/30/14 1315  Height: 5\' 8"  (1.727 m)  Weight: 372 lb 3.2 oz (168.829 kg)   Body mass index is 56.61 kg/(m^2). Ideal Body Weight: Weight in (lb) to have BMI = 25: 164.1  GEN: WDWN, NAD, Non-toxic, A & O x 3, obese, looks well HEENT: Atraumatic, Normocephalic. Neck supple. No masses, No LAD.  Bilateral TM wnl, oropharynx normal.  PEERL,EOMI.    Minimal injection of right eye, normal fundoscopic exam Ears and Nose: No external deformity. CV: RRR, No M/G/R. No JVD. No thrill. No extra heart sounds. PULM: CTA B, no wheezes, crackles, rhonchi. No retractions. No resp. distress. No accessory muscle use. EXTR: No c/c/e NEURO Normal gait.  PSYCH: Normally interactive. Conversant. Not depressed or anxious appearing.  Calm demeanor.  Right eye: fluorescin shows mild uptake consistent with irritation.  No true  abrasion noted    Assessment and Plan: Corneal abrasion, right, initial encounter - Plan: Moxifloxacin HCl 0.5 % SOLN  Treat for likely mild corneal irritation due to lenses.  She will continue to use her glasses, and will let me know if not better soon Signed Lamar Blinks, MD

## 2014-10-30 NOTE — Patient Instructions (Signed)
Stay in your glasses until your symptoms are gone- let me know if you are not better in 1-2 days Use the antibiotic drops as directed.   Call or otherwise seek care right away if you are getting worse!

## 2014-11-01 ENCOUNTER — Ambulatory Visit: Payer: Self-pay | Admitting: Dietician

## 2014-11-17 ENCOUNTER — Encounter: Payer: Self-pay | Admitting: Family Medicine

## 2014-11-20 ENCOUNTER — Other Ambulatory Visit: Payer: Self-pay | Admitting: Family Medicine

## 2014-11-20 MED ORDER — VITAMIN D (ERGOCALCIFEROL) 1.25 MG (50000 UNIT) PO CAPS: 50000.0000 [IU] | ORAL_CAPSULE | ORAL | Status: DC

## 2014-12-13 ENCOUNTER — Ambulatory Visit (INDEPENDENT_AMBULATORY_CARE_PROVIDER_SITE_OTHER): Payer: Self-pay | Admitting: Surgery

## 2014-12-13 NOTE — H&P (Signed)
Alexandra Petty 09/20/2014 4:27 PM Location: [facility] Patient #: [id number] DOB: [birth year] Married / Language: Undefined / Race: Undefined Female  History of Present Illness Rodman Key B. Hassell Done MD; 09/20/2014 5:08 PM) Patient words: weight loss surgery consult.  The patient is a 34 year old female who presents for a bariatric surgery evaluation. Alexandra Petty is a 34 year old white female has been a one of our seminars and is interested in a Roux-en-Y gastric bypass. She is followed over at urgent care [place] by Dr. Leward Quan. She's had numerous attempts at weight loss with some weight loss and regained. Her comorbidities include hypertension. She currently has a BMI of 49. I discussed Roux-en-Y gastric bypass with the aid of her chart in some detail. She's RDW research on this does for where well aware of the procedure and its risks and benefits. She wants to move toward [name]. gastric bypass.   Other Problems Alexandra Petty, [place]; 09/20/2014 4:28 PM) High blood pressure  Past Surgical History Alexandra Petty, [place]; 09/20/2014 4:28 PM) Appendectomy  Diagnostic Studies History Alexandra Petty, [place]; 09/20/2014 4:28 PM) Colonoscopy never Mammogram within last year Pap Smear 1-5 years ago  Allergies Alexandra Petty, [place]; 09/20/2014 4:28 PM) No Known Drug Allergies10/14/2015  Medication History Alexandra Petty, [place]; 09/20/2014 4:28 PM) Citalopram Hydrobromide (20MG  Tablet, Oral) Active. Vitamin D (Ergocalciferol) (50000UNIT Capsule, Oral) Active.  Social History Alexandra Petty, [place]; 09/20/2014 4:28 PM) Alcohol use Occasional alcohol use. Caffeine use Coffee, Tea. No drug use Tobacco use Never smoker.  Family History Alexandra Petty, [place]; 09/20/2014 4:28 PM) Alcohol Abuse Father. Arthritis Mother. Diabetes Mellitus Father. Heart Disease Father. Hypertension Father.  Pregnancy / Birth History Alexandra Petty, [place]; 09/20/2014 4:28 PM) Age at menarche 65  years. Gravida 0 Para 0 Regular periods  Review of Systems Alexandra Low [place] MA; 09/20/2014 4:28 PM) General Not Present- Appetite Loss, Chills, Fatigue, Fever, Night Sweats, Weight Gain and Weight Loss. Skin Not Present- Change in Wart/Mole, Dryness, Hives, Jaundice, New Lesions, Non-Healing Wounds, Rash and Ulcer. HEENT Present- Wears glasses/contact lenses. Not Present- Earache, Hearing Loss, Hoarseness, Nose Bleed, Oral Ulcers, Ringing in the Ears, Seasonal Allergies, Sinus Pain, Sore Throat, Visual Disturbances and Yellow Eyes. Respiratory Not Present- Bloody sputum, Chronic Cough, Difficulty Breathing, Snoring and Wheezing. Breast Not Present- Breast Mass, Breast Pain, Nipple Discharge and Skin Changes. Cardiovascular Present- Difficulty Breathing Lying Down and Swelling of Extremities. Not Present- Chest Pain, Leg Cramps, Palpitations, Rapid Heart Rate and Shortness of Breath. Gastrointestinal Not Present- Abdominal Pain, Bloating, Bloody Stool, Change in Bowel Habits, Chronic diarrhea, Constipation, Difficulty Swallowing, Excessive gas, Gets full quickly at meals, Hemorrhoids, Indigestion, Nausea, Rectal Pain and Vomiting. Female Genitourinary Not Present- Frequency, Nocturia, Painful Urination, Pelvic Pain and Urgency. Musculoskeletal Not Present- Back Pain, Joint Pain, Joint Stiffness, Muscle Pain, Muscle Weakness and Swelling of Extremities. Neurological Not Present- Decreased Memory, Fainting, Headaches, Numbness, Seizures, Tingling, Tremor, Trouble walking and Weakness. Psychiatric Not Present- Anxiety, Bipolar, Change in Sleep Pattern, Depression, Fearful and Frequent crying. Endocrine Not Present- Cold Intolerance, Excessive Hunger, Hair Changes, Heat Intolerance, Hot flashes and New Diabetes. Hematology Not Present- Easy Bruising, Excessive bleeding, Gland problems, HIV and Persistent Infections.   Vitals Alexandra Costa MA; 09/20/2014 4:28 PM) 09/20/2014 4:28 PM Weight: 327.8  lb Height: 68in Body Surface Area: 2.67 m Body Mass Index: 49.84 kg/m Temp.: 61F(Temporal)  Pulse: 71 (Regular)  Resp.: 16 (Unlabored)  BP: 128/84 (Sitting, Left Arm, Standard)    Physical Exam ([place] Warriner B. Hassell Done MD; 09/20/2014 5:12 PM) The physical  exam findings are as follows: Note:Obese white female no acute distress.BMI 49.6, blood pressure 128/84, afebrile, heart rate 71 and regular, height 5 feet 8 Head normocephalic, sclera nonicteric pupils are equal round react to light. Nose and throat exam are unremarkable. Neck supple without adenopathy or thyromegaly. Chest clear to auscultation Heart sinus rhythm without murmurs or gallops Abdomen obese and nontender. Prior appendectomy   genitourinary not examined Extremities full range of motionwith no cyanosis edema or clubbing Neuro alert and oriented x3. Motor sensory function grossly intact Psych good cognition and normal communicative skills.    Assessment & Plan Rodman Key B. Hassell Done MD; 09/20/2014 5:15 PM) MORBID OBESITY (278.01  E66.01) Impression: I think that she is a good candidate for Roux-en-Y gastric bypass.  COMPLETE ABDOMINAL ULTRASOUND (76700)--No gallstones  UGI W/ KUB (74241)-small hiatus hernia  I discussed gastric bypass surgery with the patient to include the risks and benefits to include, but not limited to: infection, bleeding, damage to surrounding structures, chronic wound healing, nerve pain, and possible recurrence. The patient voiced understanding and wishes to proceed at this time. She is ready for [name] gastric bypass.  Informed consent completed.

## 2014-12-18 ENCOUNTER — Encounter: Payer: No Typology Code available for payment source | Attending: Surgery

## 2014-12-18 DIAGNOSIS — E669 Obesity, unspecified: Secondary | ICD-10-CM | POA: Diagnosis not present

## 2014-12-18 DIAGNOSIS — Z713 Dietary counseling and surveillance: Secondary | ICD-10-CM | POA: Insufficient documentation

## 2014-12-18 DIAGNOSIS — Z6841 Body Mass Index (BMI) 40.0 and over, adult: Secondary | ICD-10-CM | POA: Diagnosis not present

## 2014-12-19 NOTE — Progress Notes (Signed)
  Pre-Operative Nutrition Class:  Appt start time: 9323   End time:  1830.  Patient was seen on 12/18/14 for Pre-Operative Bariatric Surgery Education at [facility].   Surgery date: 01/01/15 Surgery type: gastric sleeve Start weight at [facility]: 332 lbs on 10/21/14 Weight today: 331 lbs  TANITA  BODY COMP RESULTS  12/18/14   BMI (kg/m^2) 50.3   Fat Mass (lbs) 188   Fat Free Mass (lbs) 143   Total Body Water (lbs) 104.5   Samples given per MNT protocol. Patient educated on appropriate usage: Premier protein shake (vanilla - qty 1) Lot #: [id number] Exp: 12/16  Unjury protein powder (chocolate - qty 1) Lot #: [id number] Exp: 11/2015  PB2 (qty 1) Lot #: [id number] Exp: 8/16  Bariatric Advantage Calcium Citrate chew (Tropical Orange - qty 1) Lot #: [id number] Exp:5/16   The following the learning objectives were met by the patient during this course:  Identify Pre-Op Dietary Goals and will begin 2 weeks pre-operatively  Identify appropriate sources of fluids and proteins   State protein recommendations and appropriate sources pre and post-operatively  Identify Post-Operative Dietary Goals and will follow for 2 weeks post-operatively  Identify appropriate multivitamin and calcium sources  Describe the need for physical activity post-operatively and will follow MD recommendations  State when to call healthcare provider regarding medication questions or post-operative complications  Handouts given during class include:  Pre-Op Bariatric Surgery Diet Handout  Protein Shake Handout  Post-Op Bariatric Surgery Nutrition Handout  BELT Program Information Flyer  Support Group Information Flyer  [facility] Bariatric Supplements Price List  Follow-Up Plan: Patient will follow-up at [facility] 2 weeks post operatively for diet advancement per MD.

## 2014-12-25 NOTE — Patient Instructions (Addendum)
Alexandra Petty  12/25/2014   Your procedure is scheduled on: 01/01/2015    Report to [facility] Main  Entrance and follow signs to               [place] at    [place].  Call this number if you have problems the morning of surgery [phone]   Remember:  Do not eat food or drink liquids :After Midnight.     Take these medicines the morning of surgery with A SIP OF WATER: none                                You may not have any metal on your body including hair pins and              piercings  Do not wear jewelry, make-up, lotions, powders or perfumes., deodorant.               Do not wear nail polish.  Do not shave  48 hours prior to surgery.            Do not bring valuables to the hospital. [place].  Contacts, dentures or bridgework may not be worn into surgery.  Leave suitcase in the car. After surgery it may be brought to your room.       Special Instructions:  Coughing and deep breathing exercises, leg exercises               Please read over the following fact sheets you were given: _____________________________________________________________________             [facility] - Preparing for Surgery Before surgery, you can play an important role.  Because skin is not sterile, your skin needs to be as free of germs as possible.  You can reduce the number of germs on your skin by washing with CHG (chlorahexidine gluconate) soap before surgery.  CHG is an antiseptic cleaner which kills germs and bonds with the skin to continue killing germs even after washing. Please DO NOT use if you have an allergy to CHG or antibacterial soaps.  If your skin becomes reddened/irritated stop using the CHG and inform your nurse when you arrive at Short Stay. Do not shave (including legs and underarms) for at least 48 hours prior to the first CHG shower.  You may shave your face/neck. Please follow these  instructions carefully:  1.  Shower with CHG Soap the night before surgery and the  morning of Surgery.  2.  If you choose to wash your hair, wash your hair first as usual with your  normal  shampoo.  3.  After you shampoo, rinse your hair and body thoroughly to remove the  shampoo.                           4.  Use CHG as you would any other liquid soap.  You can apply chg directly  to the skin and wash                       Gently with a scrungie or clean washcloth.  5.  Apply the CHG Soap to your  body ONLY FROM THE NECK DOWN.   Do not use on face/ open                           Wound or open sores. Avoid contact with eyes, ears mouth and genitals (private parts).                       Wash face,  Genitals (private parts) with your normal soap.             6.  Wash thoroughly, paying special attention to the area where your surgery  will be performed.  7.  Thoroughly rinse your body with warm water from the neck down.  8.  DO NOT shower/wash with your normal soap after using and rinsing off  the CHG Soap.                9.  Pat yourself dry with a clean towel.            10.  Wear clean pajamas.            11.  Place clean sheets on your bed the night of your first shower and do not  sleep with pets. Day of Surgery : Do not apply any lotions/deodorants the morning of surgery.  Please wear clean clothes to the hospital/surgery center.  FAILURE TO FOLLOW THESE INSTRUCTIONS MAY RESULT IN THE CANCELLATION OF YOUR SURGERY PATIENT SIGNATURE_________________________________  NURSE SIGNATURE__________________________________  ________________________________________________________________________

## 2014-12-27 ENCOUNTER — Encounter (HOSPITAL_COMMUNITY): Payer: Self-pay

## 2014-12-27 ENCOUNTER — Encounter (HOSPITAL_COMMUNITY)
Admission: RE | Admit: 2014-12-27 | Discharge: 2014-12-27 | Disposition: A | Discharge status: Home / Self Care | Payer: No Typology Code available for payment source | Source: Ambulatory Visit | Attending: Surgery | Admitting: Surgery

## 2014-12-27 DIAGNOSIS — Z01818 Encounter for other preprocedural examination: Secondary | ICD-10-CM | POA: Insufficient documentation

## 2014-12-27 HISTORY — DX: Anxiety disorder, unspecified: F41.9

## 2014-12-27 HISTORY — DX: Gastro-esophageal reflux disease without esophagitis: K21.9

## 2014-12-27 LAB — COMPREHENSIVE METABOLIC PANEL
ALK PHOS: 90 U/L (ref 39–117)
ALT: 14 U/L (ref 0–35)
AST: 15 U/L (ref 0–37)
Albumin: 3.8 g/dL (ref 3.5–5.2)
Anion gap: 7 (ref 5–15)
BILIRUBIN TOTAL: 0.5 mg/dL (ref 0.3–1.2)
BUN: 16 mg/dL (ref 6–23)
CHLORIDE: 106 meq/L (ref 96–112)
CO2: 24 mmol/L (ref 19–32)
Calcium: 9.1 mg/dL (ref 8.4–10.5)
Creatinine, Ser: 0.68 mg/dL (ref 0.50–1.10)
GFR calc Af Amer: >90 mL/min (ref >90)
Glucose, Bld: 91 mg/dL (ref 70–99)
POTASSIUM: 4.2 mmol/L (ref 3.5–5.1)
SODIUM: 137 mmol/L (ref 135–145)
TOTAL PROTEIN: 7.7 g/dL (ref 6.0–8.3)

## 2014-12-27 LAB — CBC WITH DIFFERENTIAL/PLATELET
BASOS ABS: 0 10*3/uL (ref 0.0–0.1)
Basophils Relative: 0 % (ref 0–1)
Eosinophils Absolute: 0.1 10*3/uL (ref 0.0–0.7)
Eosinophils Relative: 2 % (ref 0–5)
HCT: 41.3 % (ref 36.0–46.0)
Hemoglobin: 13.9 g/dL (ref 12.0–15.0)
LYMPHS ABS: 2.2 10*3/uL (ref 0.7–4.0)
Lymphocytes Relative: 28 % (ref 12–46)
MCH: 30.5 pg (ref 26.0–34.0)
MCHC: 33.7 g/dL (ref 30.0–36.0)
MCV: 90.8 fL (ref 78.0–100.0)
Monocytes Absolute: 0.6 10*3/uL (ref 0.1–1.0)
Monocytes Relative: 8 % (ref 3–12)
NEUTROS PCT: 62 % (ref 43–77)
Neutro Abs: 4.9 10*3/uL (ref 1.7–7.7)
PLATELETS: 368 10*3/uL (ref 150–400)
RBC: 4.55 MIL/uL (ref 3.87–5.11)
RDW: 12.7 % (ref 11.5–15.5)
WBC: 7.9 10*3/uL (ref 4.0–10.5)

## 2014-12-27 NOTE — Progress Notes (Signed)
CXR and EKG 10/18/2014 in EPIC.

## 2014-12-31 NOTE — Anesthesia Preprocedure Evaluation (Addendum)
Anesthesia Evaluation  Patient identified by MRN, date of birth, ID band Patient awake    Reviewed: Allergy & Precautions, NPO status , Patient's Chart, lab work & pertinent test results, reviewed documented beta blocker date and time   Airway Mallampati: II   Neck ROM: Full    Dental  (+) Teeth Intact, Dental Advisory Given   Pulmonary neg pulmonary ROS,  breath sounds clear to auscultation        Cardiovascular Rhythm:Regular  EKG 10/2014 WNL   Neuro/Psych Anxiety Depression    GI/Hepatic Neg liver ROS, GERD-  Medicated,  Endo/Other  negative endocrine ROS  Renal/GU GFR 90     Musculoskeletal   Abdominal (+) + obese,   Peds  Hematology 13/41   Anesthesia Other Findings   Reproductive/Obstetrics                            Anesthesia Physical Anesthesia Plan  ASA: III  Anesthesia Plan: General   Post-op Pain Management:    Induction: Intravenous  Airway Management Planned: Oral ETT  Additional Equipment:   Intra-op Plan:   Post-operative Plan: Extubation in OR  Informed Consent: I have reviewed the patients History and Physical, chart, labs and discussed the procedure including the risks, benefits and alternatives for the proposed anesthesia with the patient or authorized representative who has indicated his/her understanding and acceptance.     Plan Discussed with:   Anesthesia Plan Comments: (Check results am PG test, mulimodal pain rx, (Tylenol, precedex, toradol, decadron))        Anesthesia Quick Evaluation

## 2015-01-01 ENCOUNTER — Inpatient Hospital Stay (HOSPITAL_COMMUNITY): Payer: No Typology Code available for payment source | Admitting: Anesthesiology

## 2015-01-01 ENCOUNTER — Encounter (HOSPITAL_COMMUNITY): Payer: Self-pay | Admitting: Anesthesiology

## 2015-01-01 ENCOUNTER — Inpatient Hospital Stay (HOSPITAL_COMMUNITY)
Admission: RE | Admit: 2015-01-01 | Discharge: 2015-01-03 | DRG: 621 | Disposition: A | Discharge status: Home / Self Care | Payer: No Typology Code available for payment source | Source: Ambulatory Visit | Attending: Surgery | Admitting: Surgery

## 2015-01-01 ENCOUNTER — Encounter (HOSPITAL_COMMUNITY)
Admission: RE | Disposition: A | Discharge status: Home / Self Care | Payer: Self-pay | Source: Ambulatory Visit | Attending: Surgery

## 2015-01-01 DIAGNOSIS — Z8249 Family history of ischemic heart disease and other diseases of the circulatory system: Secondary | ICD-10-CM

## 2015-01-01 DIAGNOSIS — Z833 Family history of diabetes mellitus: Secondary | ICD-10-CM

## 2015-01-01 DIAGNOSIS — K449 Diaphragmatic hernia without obstruction or gangrene: Secondary | ICD-10-CM | POA: Diagnosis present

## 2015-01-01 DIAGNOSIS — Z6841 Body Mass Index (BMI) 40.0 and over, adult: Secondary | ICD-10-CM

## 2015-01-01 DIAGNOSIS — I10 Essential (primary) hypertension: Secondary | ICD-10-CM | POA: Diagnosis present

## 2015-01-01 DIAGNOSIS — Z9884 Bariatric surgery status: Secondary | ICD-10-CM

## 2015-01-01 DIAGNOSIS — Z01812 Encounter for preprocedural laboratory examination: Secondary | ICD-10-CM

## 2015-01-01 HISTORY — PX: GASTRIC ROUX-EN-Y: SHX5262

## 2015-01-01 HISTORY — PX: HIATAL HERNIA REPAIR: SHX195

## 2015-01-01 LAB — CBC
HCT: 40.8 % (ref 36.0–46.0)
HEMOGLOBIN: 13.7 g/dL (ref 12.0–15.0)
MCH: 31 pg (ref 26.0–34.0)
MCHC: 33.6 g/dL (ref 30.0–36.0)
MCV: 92.3 fL (ref 78.0–100.0)
PLATELETS: 323 10*3/uL (ref 150–400)
RBC: 4.42 MIL/uL (ref 3.87–5.11)
RDW: 13 % (ref 11.5–15.5)
WBC: 24 10*3/uL — AB (ref 4.0–10.5)

## 2015-01-01 LAB — CREATININE, SERUM
Creatinine, Ser: 0.99 mg/dL (ref 0.50–1.10)
GFR calc Af Amer: 85 mL/min — ABNORMAL LOW (ref >90)
GFR, EST NON AFRICAN AMERICAN: 73 mL/min — AB (ref >90)

## 2015-01-01 LAB — PREGNANCY, URINE: Preg Test, Ur: NEGATIVE

## 2015-01-01 LAB — GLUCOSE, CAPILLARY: Glucose-Capillary: 90 mg/dL (ref 70–99)

## 2015-01-01 SURGERY — LAPAROSCOPIC ROUX-EN-Y GASTRIC BYPASS WITH UPPER ENDOSCOPY
Anesthesia: General | Site: Abdomen

## 2015-01-01 MED ORDER — PROPOFOL 10 MG/ML IV BOLUS
INTRAVENOUS | Status: DC | PRN
  Administered 2015-01-01: 200 mg via INTRAVENOUS

## 2015-01-01 MED ORDER — DEXTROSE-NACL 5-0.45 % IV SOLN: INTRAVENOUS | Status: DC

## 2015-01-01 MED ORDER — TISSEEL VH 10 ML EX KIT
PACK | CUTANEOUS | Status: DC | PRN
  Administered 2015-01-01: 10 mL

## 2015-01-01 MED ORDER — ROCURONIUM BROMIDE 100 MG/10ML IV SOLN
INTRAVENOUS | Status: AC
  Filled 2015-01-01: qty 1

## 2015-01-01 MED ORDER — POTASSIUM CHLORIDE 2 MEQ/ML IV SOLN
INTRAVENOUS | Status: DC
  Administered 2015-01-01 – 2015-01-02 (×4): via INTRAVENOUS
  Filled 2015-01-01 (×7): qty 1000

## 2015-01-01 MED ORDER — LIP MEDEX EX OINT
TOPICAL_OINTMENT | CUTANEOUS | Status: AC
  Administered 2015-01-01: 18:00:00
  Filled 2015-01-01: qty 7

## 2015-01-01 MED ORDER — MORPHINE SULFATE 2 MG/ML IJ SOLN
INTRAMUSCULAR | Status: AC
  Filled 2015-01-01: qty 1

## 2015-01-01 MED ORDER — TISSEEL VH 10 ML EX KIT
PACK | CUTANEOUS | Status: AC
  Filled 2015-01-01: qty 2

## 2015-01-01 MED ORDER — MORPHINE SULFATE 2 MG/ML IJ SOLN
2.0000 mg | INTRAMUSCULAR | Status: DC | PRN
  Administered 2015-01-01 (×4): 2 mg via INTRAVENOUS
  Administered 2015-01-01: 4 mg via INTRAVENOUS
  Administered 2015-01-02: 2 mg via INTRAVENOUS
  Administered 2015-01-02 (×2): 4 mg via INTRAVENOUS
  Filled 2015-01-01 (×3): qty 2
  Filled 2015-01-01 (×6): qty 1

## 2015-01-01 MED ORDER — SUCCINYLCHOLINE CHLORIDE 20 MG/ML IJ SOLN
INTRAMUSCULAR | Status: DC | PRN
  Administered 2015-01-01: 180 mg via INTRAVENOUS

## 2015-01-01 MED ORDER — GLYCOPYRROLATE 0.2 MG/ML IJ SOLN
INTRAMUSCULAR | Status: DC | PRN
  Administered 2015-01-01: .6 mg via INTRAVENOUS

## 2015-01-01 MED ORDER — ROCURONIUM BROMIDE 100 MG/10ML IV SOLN
INTRAVENOUS | Status: DC | PRN
  Administered 2015-01-01: 20 mg via INTRAVENOUS
  Administered 2015-01-01: 10 mg via INTRAVENOUS
  Administered 2015-01-01: 40 mg via INTRAVENOUS
  Administered 2015-01-01 (×2): 10 mg via INTRAVENOUS

## 2015-01-01 MED ORDER — PROPOFOL 10 MG/ML IV BOLUS
INTRAVENOUS | Status: AC
  Filled 2015-01-01: qty 20

## 2015-01-01 MED ORDER — ONDANSETRON HCL 4 MG/2ML IJ SOLN
INTRAMUSCULAR | Status: DC | PRN
  Administered 2015-01-01: 4 mg via INTRAVENOUS

## 2015-01-01 MED ORDER — KETAMINE HCL 10 MG/ML IJ SOLN
INTRAMUSCULAR | Status: AC
  Filled 2015-01-01: qty 1

## 2015-01-01 MED ORDER — FENTANYL CITRATE 0.05 MG/ML IJ SOLN: 25.0000 ug | INTRAMUSCULAR | Status: DC | PRN

## 2015-01-01 MED ORDER — ACETAMINOPHEN 10 MG/ML IV SOLN
1000.0000 mg | Freq: Once | INTRAVENOUS | Status: AC
  Administered 2015-01-01: 1000 mg via INTRAVENOUS
  Filled 2015-01-01: qty 100

## 2015-01-01 MED ORDER — DEXTROSE 5 % IV SOLN
INTRAVENOUS | Status: AC
  Filled 2015-01-01: qty 2

## 2015-01-01 MED ORDER — ACETAMINOPHEN 160 MG/5ML PO SOLN
650.0000 mg | ORAL | Status: DC | PRN
  Administered 2015-01-03: 650 mg via ORAL
  Filled 2015-01-01: qty 20.3

## 2015-01-01 MED ORDER — UNJURY VANILLA POWDER: 2.0000 [oz_av] | Freq: Four times a day (QID) | ORAL | Status: DC

## 2015-01-01 MED ORDER — ONDANSETRON HCL 4 MG/2ML IJ SOLN
4.0000 mg | INTRAMUSCULAR | Status: DC | PRN
Start: 2015-01-01 — End: 2015-01-03
  Administered 2015-01-01 – 2015-01-02 (×6): 4 mg via INTRAVENOUS
  Filled 2015-01-01 (×6): qty 2

## 2015-01-01 MED ORDER — CHLORHEXIDINE GLUCONATE CLOTH 2 % EX PADS: 6.0000 | MEDICATED_PAD | Freq: Once | CUTANEOUS | Status: DC

## 2015-01-01 MED ORDER — BUPIVACAINE LIPOSOME 1.3 % IJ SUSP
20.0000 mL | Freq: Once | INTRAMUSCULAR | Status: DC
  Filled 2015-01-01: qty 20

## 2015-01-01 MED ORDER — PROMETHAZINE HCL 25 MG/ML IJ SOLN
6.2500 mg | INTRAMUSCULAR | Status: AC | PRN
  Administered 2015-01-01 (×2): 6.25 mg via INTRAVENOUS

## 2015-01-01 MED ORDER — NEOSTIGMINE METHYLSULFATE 10 MG/10ML IV SOLN
INTRAVENOUS | Status: AC
  Filled 2015-01-01: qty 1

## 2015-01-01 MED ORDER — LIDOCAINE HCL (CARDIAC) 20 MG/ML IV SOLN
INTRAVENOUS | Status: AC
  Filled 2015-01-01: qty 5

## 2015-01-01 MED ORDER — OXYCODONE HCL 5 MG/5ML PO SOLN
5.0000 mg | ORAL | Status: DC | PRN
  Administered 2015-01-02 (×2): 10 mg via ORAL
  Administered 2015-01-03: 5 mg via ORAL
  Filled 2015-01-01: qty 5
  Filled 2015-01-01 (×2): qty 10

## 2015-01-01 MED ORDER — MIDAZOLAM HCL 5 MG/5ML IJ SOLN
INTRAMUSCULAR | Status: DC | PRN
  Administered 2015-01-01: 2 mg via INTRAVENOUS

## 2015-01-01 MED ORDER — PROMETHAZINE HCL 25 MG/ML IJ SOLN
INTRAMUSCULAR | Status: AC
  Filled 2015-01-01: qty 1

## 2015-01-01 MED ORDER — UNJURY CHOCOLATE CLASSIC POWDER
2.0000 [oz_av] | Freq: Four times a day (QID) | ORAL | Status: DC
  Administered 2015-01-03: 2 [oz_av] via ORAL

## 2015-01-01 MED ORDER — HEPARIN SODIUM (PORCINE) 5000 UNIT/ML IJ SOLN
5000.0000 [IU] | INTRAMUSCULAR | Status: AC
  Administered 2015-01-01: 5000 [IU] via SUBCUTANEOUS
  Filled 2015-01-01: qty 1

## 2015-01-01 MED ORDER — LACTATED RINGERS IV SOLN
INTRAVENOUS | Status: DC | PRN
  Administered 2015-01-01 (×2): via INTRAVENOUS

## 2015-01-01 MED ORDER — BUPIVACAINE LIPOSOME 1.3 % IJ SUSP
INTRAMUSCULAR | Status: DC | PRN
  Administered 2015-01-01 (×2): 20 mL

## 2015-01-01 MED ORDER — ACETAMINOPHEN 160 MG/5ML PO SOLN: 325.0000 mg | ORAL | Status: DC | PRN

## 2015-01-01 MED ORDER — DEXMEDETOMIDINE HCL IN NACL 400 MCG/100ML IV SOLN
0.4000 ug/kg/h | INTRAVENOUS | Status: DC
  Administered 2015-01-01: 0.2 ug/kg/h via INTRAVENOUS
  Filled 2015-01-01: qty 100

## 2015-01-01 MED ORDER — KETAMINE HCL 10 MG/ML IJ SOLN
INTRAMUSCULAR | Status: DC | PRN
  Administered 2015-01-01: 70 mg via INTRAVENOUS

## 2015-01-01 MED ORDER — NEOSTIGMINE METHYLSULFATE 10 MG/10ML IV SOLN
INTRAVENOUS | Status: DC | PRN
  Administered 2015-01-01: 4 mg via INTRAVENOUS

## 2015-01-01 MED ORDER — FENTANYL CITRATE 0.05 MG/ML IJ SOLN
INTRAMUSCULAR | Status: DC | PRN
  Administered 2015-01-01: 100 ug via INTRAVENOUS
  Administered 2015-01-01 (×3): 50 ug via INTRAVENOUS

## 2015-01-01 MED ORDER — DEXAMETHASONE SODIUM PHOSPHATE 10 MG/ML IJ SOLN
INTRAMUSCULAR | Status: DC | PRN
  Administered 2015-01-01: 10 mg via INTRAVENOUS

## 2015-01-01 MED ORDER — DEXTROSE 5 % IV SOLN
2.0000 g | INTRAVENOUS | Status: AC
  Administered 2015-01-01 (×2): 2 g via INTRAVENOUS

## 2015-01-01 MED ORDER — LACTATED RINGERS IR SOLN
Status: DC | PRN
  Administered 2015-01-01: 1000 mL

## 2015-01-01 MED ORDER — ONDANSETRON HCL 4 MG/2ML IJ SOLN
INTRAMUSCULAR | Status: AC
  Filled 2015-01-01: qty 2

## 2015-01-01 MED ORDER — MEPERIDINE HCL 50 MG/ML IJ SOLN: 6.2500 mg | INTRAMUSCULAR | Status: DC | PRN

## 2015-01-01 MED ORDER — PHENYLEPHRINE HCL 10 MG/ML IJ SOLN
INTRAMUSCULAR | Status: DC | PRN
  Administered 2015-01-01 (×2): 40 ug via INTRAVENOUS
  Administered 2015-01-01: 80 ug via INTRAVENOUS

## 2015-01-01 MED ORDER — GLYCOPYRROLATE 0.2 MG/ML IJ SOLN
INTRAMUSCULAR | Status: AC
  Filled 2015-01-01: qty 4

## 2015-01-01 MED ORDER — FENTANYL CITRATE 0.05 MG/ML IJ SOLN
INTRAMUSCULAR | Status: AC
  Filled 2015-01-01: qty 5

## 2015-01-01 MED ORDER — HEPARIN SODIUM (PORCINE) 5000 UNIT/ML IJ SOLN
5000.0000 [IU] | Freq: Three times a day (TID) | INTRAMUSCULAR | Status: DC
  Administered 2015-01-01 – 2015-01-03 (×6): 5000 [IU] via SUBCUTANEOUS
  Filled 2015-01-01 (×8): qty 1

## 2015-01-01 MED ORDER — MIDAZOLAM HCL 2 MG/2ML IJ SOLN
INTRAMUSCULAR | Status: AC
  Filled 2015-01-01: qty 2

## 2015-01-01 MED ORDER — LIDOCAINE HCL (CARDIAC) 20 MG/ML IV SOLN
INTRAVENOUS | Status: DC | PRN
  Administered 2015-01-01: 100 mg via INTRAVENOUS

## 2015-01-01 MED ORDER — SODIUM CHLORIDE 0.9 % IJ SOLN
INTRAMUSCULAR | Status: AC
  Filled 2015-01-01: qty 20

## 2015-01-01 MED ORDER — UNJURY CHICKEN SOUP POWDER
2.0000 [oz_av] | Freq: Four times a day (QID) | ORAL | Status: DC
  Administered 2015-01-03: 2 [oz_av] via ORAL

## 2015-01-01 MED ORDER — PROMETHAZINE HCL 25 MG/ML IJ SOLN: 12.5000 mg | Freq: Four times a day (QID) | INTRAMUSCULAR | Status: DC | PRN

## 2015-01-01 SURGICAL SUPPLY — 61 items
APPLICATOR COTTON TIP 6IN STRL (MISCELLANEOUS) IMPLANT
BENZOIN TINCTURE PRP APPL 2/3 (GAUZE/BANDAGES/DRESSINGS) IMPLANT
BLADE SURG 15 STRL LF DISP TIS (BLADE) ×1 IMPLANT
BLADE SURG 15 STRL SS (BLADE) ×1
CABLE HIGH FREQUENCY MONO STRZ (ELECTRODE) IMPLANT
CLIP SUT LAPRA TY ABSORB (SUTURE) ×2 IMPLANT
DEVICE SUT QUICK LOAD TK 5 (STAPLE) ×6 IMPLANT
DEVICE SUT TI-KNOT TK 5X26 (MISCELLANEOUS) ×2 IMPLANT
DEVICE SUTURE ENDOST 10MM (ENDOMECHANICALS) ×2 IMPLANT
DISSECTOR BLUNT TIP ENDO 5MM (MISCELLANEOUS) ×2 IMPLANT
DRAIN PENROSE 18X1/4 LTX STRL (WOUND CARE) ×2 IMPLANT
DRAPE CAMERA CLOSED 9X96 (DRAPES) ×2 IMPLANT
GAUZE SPONGE 4X4 12PLY STRL (GAUZE/BANDAGES/DRESSINGS) ×2 IMPLANT
GAUZE SPONGE 4X4 16PLY XRAY LF (GAUZE/BANDAGES/DRESSINGS) ×2 IMPLANT
GLOVE BIOGEL M 8.0 STRL (GLOVE) ×2 IMPLANT
GOWN STRL REUS W/TWL XL LVL3 (GOWN DISPOSABLE) ×8 IMPLANT
HANDLE STAPLE EGIA 4 XL (STAPLE) ×2 IMPLANT
HOVERMATT SINGLE USE (MISCELLANEOUS) ×2 IMPLANT
KIT BASIN OR (CUSTOM PROCEDURE TRAY) ×2 IMPLANT
KIT GASTRIC LAVAGE 34FR ADT (SET/KITS/TRAYS/PACK) ×2 IMPLANT
NEEDLE SPNL 22GX3.5 QUINCKE BK (NEEDLE) ×2 IMPLANT
PACK CARDIOVASCULAR III (CUSTOM PROCEDURE TRAY) ×2 IMPLANT
PEN SKIN MARKING BROAD (MISCELLANEOUS) ×2 IMPLANT
RELOAD EGIA 45 MED/THCK PURPLE (STAPLE) IMPLANT
RELOAD EGIA 45 TAN VASC (STAPLE) IMPLANT
RELOAD EGIA 60 MED/THCK PURPLE (STAPLE) ×2 IMPLANT
RELOAD EGIA 60 TAN VASC (STAPLE) ×4 IMPLANT
RELOAD ENDO STITCH 2.0 (ENDOMECHANICALS) ×9
RELOAD TRI 45 ART MED THCK PUR (STAPLE) ×2 IMPLANT
RELOAD TRI 60 ART MED THCK PUR (STAPLE) ×6 IMPLANT
SCISSORS LAP 5X45 EPIX DISP (ENDOMECHANICALS) ×2 IMPLANT
SEALANT SURGICAL APPL DUAL CAN (MISCELLANEOUS) ×2 IMPLANT
SET IRRIG TUBING LAPAROSCOPIC (IRRIGATION / IRRIGATOR) ×2 IMPLANT
SHEARS CURVED HARMONIC AC 45CM (MISCELLANEOUS) ×2 IMPLANT
SLEEVE ADV FIXATION 12X100MM (TROCAR) ×6 IMPLANT
SLEEVE ADV FIXATION 5X100MM (TROCAR) ×2 IMPLANT
SOLUTION ANTI FOG 6CC (MISCELLANEOUS) ×2 IMPLANT
STAPLER VISISTAT 35W (STAPLE) ×2 IMPLANT
STRIP CLOSURE SKIN 1/2X4 (GAUZE/BANDAGES/DRESSINGS) IMPLANT
SUT RELOAD ENDO STITCH 2 48X1 (ENDOMECHANICALS) ×5
SUT RELOAD ENDO STITCH 2.0 (ENDOMECHANICALS) ×4
SUT SURGIDAC NAB ES-9 0 48 120 (SUTURE) IMPLANT
SUT VIC AB 2-0 SH 27 (SUTURE) ×1
SUT VIC AB 2-0 SH 27X BRD (SUTURE) ×1 IMPLANT
SUT VIC AB 4-0 SH 18 (SUTURE) ×2 IMPLANT
SUTURE RELOAD END STTCH 2 48X1 (ENDOMECHANICALS) ×5 IMPLANT
SUTURE RELOAD ENDO STITCH 2.0 (ENDOMECHANICALS) ×4 IMPLANT
SYR 20CC LL (SYRINGE) ×4 IMPLANT
SYR 50ML LL SCALE MARK (SYRINGE) ×2 IMPLANT
TAPE CLOTH SURG 4X10 WHT LF (GAUZE/BANDAGES/DRESSINGS) ×2 IMPLANT
TOWEL OR 17X26 10 PK STRL BLUE (TOWEL DISPOSABLE) ×4 IMPLANT
TOWEL OR NON WOVEN STRL DISP B (DISPOSABLE) ×2 IMPLANT
TRAY FOLEY CATH 14FRSI W/METER (CATHETERS) ×2 IMPLANT
TROCAR ADV FIXATION 12X100MM (TROCAR) ×2 IMPLANT
TROCAR ADV FIXATION 5X100MM (TROCAR) ×2 IMPLANT
TROCAR BLADELESS OPT 5 100 (ENDOMECHANICALS) ×2 IMPLANT
TROCAR UNIVERSAL OPT 12M 100M (ENDOMECHANICALS) ×2 IMPLANT
TROCAR XCEL 12X100 BLDLESS (ENDOMECHANICALS) ×2 IMPLANT
TUBING CONNECTING 10 (TUBING) ×2 IMPLANT
TUBING ENDO SMARTCAP PENTAX (MISCELLANEOUS) ×2 IMPLANT
TUBING FILTER THERMOFLATOR (ELECTROSURGICAL) ×2 IMPLANT

## 2015-01-01 NOTE — Op Note (Signed)
Surgeon: Althea Grimmer. Hassell Done, MD, FACS Asst:  Greer Pickerel, MD, FACS Anesthesia: General endotracheal Drains: None  Procedure: Repair of visible hiatus hernia with one suture posteriorally.  Laparoscopic [name] gastric bypass with 40 cm BP limb and 100 cm Roux limb, antecolic, antegastric, candy cane to the left.  Closure of Peterson's defect. Upper endoscopy.   Description of Procedure:  The patient was taken to OR 1 at [facility] and given general anesthesia.  The abdomen was prepped with PCMX and draped sterilely.  A time out was performed.  After entering through the left upper quadrant with 12 mm Optiview, I placed standard trocars for a roux Y.   The operation began by identifying the ligament of Treitz. I measured 40 cm downstream and divided the bowel with a 6 cm Covidian stapler.  I sutured a Penrose drain along the Roux limb end.  I measured a 1 meter (100 cm) Roux limb and then placed the distal bowels to the BP limb side by side and performed a stapled jejunojejunostomy. The common defect was closed from either end with 4-0 Vicryl using the Endo Stitch. The mesenteric defect was closed with a running 2-0 silk using the Endo Stitch. Tisseel was applied to the suture line.  The omentum was divided with the harmonic scalpel.  The Nathanson retractor was inserted in the left lateral segment of liver was retracted. The foregut dissection ensued.  I saw a visible dimple that was substantial.  I dissected the EG junction preserving a large vessel in the gastrohepatic window.  The herniated fat pad was brought down into the abdomen.  I went posteriorally and placed a single suture that close the hiatus substantially.  A gastric pouch was created first measuring 5 cm along the lessor curvature.  The pouch was made with two firings of the purple load Covidien stapler and then two loads using the TRS buttress material.    The Roux limb was then brought up with the candycane pointed left and a back row of  sutures of 2-0 Vicryl were placed. I opened along the right side of each structure and inserted the 4.5 cm stapler to create the gastrojejunostomy.  I had to place a higher and more lateral trocar to get the correct angle for the stapled anastomosis.   The common defect was closed from either end with 2-0 Vicryl and a second row was placed anterior to that the Ewald tube acting as a stent across the anastomosis. The Penrose drain was removed. Peterson's defect was closed with 2-0 silk.   Endoscopy was performed by Dr. Redmond Pulling.  No bleeding or bubbles were noted.    The incisions were injected with Exparel  and were closed with 4-0 Vicryl and staples.  The patient was taken to the recovery room in satisfactory condition.  Matt B. Hassell Done, MD, FACS

## 2015-01-01 NOTE — Anesthesia Postprocedure Evaluation (Signed)
  Anesthesia Post-op Note  Patient: Alexandra Petty  Procedure(s) Performed: Procedure(s): LAPAROSCOPIC ROUX-EN-Y GASTRIC BYPASS WITH UPPER ENDOSCOPY (N/A) LAPAROSCOPIC REPAIR OF HIATAL HERNIA (N/A)  Patient Location: PACU  Anesthesia Type:General  Level of Consciousness: awake and alert   Airway and Oxygen Therapy: Patient Spontanous Breathing and Patient connected to nasal cannula oxygen  Post-op Pain: mild  Post-op Assessment: Post-op Vital signs reviewed, Patient's Cardiovascular Status Stable, Respiratory Function Stable, Patent Airway and No signs of Nausea or vomiting  Post-op Vital Signs: Reviewed and stable  Last Vitals:  Filed Vitals:   01/01/15 1156  BP: 132/83  Pulse: 62  Temp:   Resp: 22    Complications: No apparent anesthesia complications

## 2015-01-01 NOTE — H&P (View-Only) (Signed)
Laqueena Hinchey 09/20/2014 4:27 PM Location: [facility] Patient #: [id number] DOB: June 22, 1981 Married / Language: Undefined / Race: Undefined Female  History of Present Illness Rodman Key B. Hassell Done MD; 09/20/2014 5:08 PM) Patient words: weight loss surgery consult.  The patient is a 34 year old female who presents for a bariatric surgery evaluation. Cecille Mcclusky is a 34 year old white female has been a one of our seminars and is interested in a Roux-en-Y gastric bypass. She is followed over at urgent care [place] [facility] by Dr. Leward Quan. She's had numerous attempts at weight loss with some weight loss and regained. Her comorbidities include hypertension. She currently has a BMI of 49. I discussed Roux-en-Y gastric bypass with the aid of her chart in some detail. She's RDW research on this does for where well aware of the procedure and its risks and benefits. She wants to move toward [name]. gastric bypass.   Other Problems Ivor Costa, [place]; 09/20/2014 4:28 PM) High blood pressure  Past Surgical History Ivor [place], [place]; 09/20/2014 4:28 PM) Appendectomy  Diagnostic Studies History Ivor [place], [place]; 09/20/2014 4:28 PM) Colonoscopy never Mammogram within last year Pap Smear 1-5 years ago  Allergies Ivor [place], [place]; 09/20/2014 4:28 PM) No Known Drug Allergies10/14/2015  Medication History Ivor [place], [place]; 09/20/2014 4:28 PM) Citalopram Hydrobromide (20MG  Tablet, Oral) Active. Vitamin D (Ergocalciferol) (50000UNIT Capsule, Oral) Active.  Social History Alyse Low [place], [place]; 09/20/2014 4:28 PM) Alcohol use Occasional alcohol use. Caffeine use Coffee, Tea. No drug use Tobacco use Never smoker.  Family History Ivor [place], [place]; 09/20/2014 4:28 PM) Alcohol Abuse Father. Arthritis Mother. Diabetes Mellitus Father. Heart Disease Father. Hypertension Father.  Pregnancy / Birth History Ivor Costa, [place]; 09/20/2014 4:28 PM) Age at menarche 61  years. Gravida 0 Para 0 Regular periods  Review of Systems Alyse Low [place]; 09/20/2014 4:28 PM) General Not Present- Appetite Loss, Chills, Fatigue, Fever, Night Sweats, Weight Gain and Weight Loss. Skin Not Present- Change in Wart/Mole, Dryness, Hives, Jaundice, New Lesions, Non-Healing Wounds, Rash and Ulcer. HEENT Present- Wears glasses/contact lenses. Not Present- Earache, Hearing Loss, Hoarseness, Nose Bleed, Oral Ulcers, Ringing in the Ears, Seasonal Allergies, Sinus Pain, Sore Throat, Visual Disturbances and Yellow Eyes. Respiratory Not Present- Bloody sputum, Chronic Cough, Difficulty Breathing, Snoring and Wheezing. Breast Not Present- Breast Mass, Breast Pain, Nipple Discharge and Skin Changes. Cardiovascular Present- Difficulty Breathing Lying Down and Swelling of Extremities. Not Present- Chest Pain, Leg Cramps, Palpitations, Rapid Heart Rate and Shortness of Breath. Gastrointestinal Not Present- Abdominal Pain, Bloating, Bloody Stool, Change in Bowel Habits, Chronic diarrhea, Constipation, Difficulty Swallowing, Excessive gas, Gets full quickly at meals, Hemorrhoids, Indigestion, Nausea, Rectal Pain and Vomiting. Female Genitourinary Not Present- Frequency, Nocturia, Painful Urination, Pelvic Pain and Urgency. Musculoskeletal Not Present- Back Pain, Joint Pain, Joint Stiffness, Muscle Pain, Muscle Weakness and Swelling of Extremities. Neurological Not Present- Decreased Memory, Fainting, Headaches, Numbness, Seizures, Tingling, Tremor, Trouble walking and Weakness. Psychiatric Not Present- Anxiety, Bipolar, Change in Sleep Pattern, Depression, Fearful and Frequent crying. Endocrine Not Present- Cold Intolerance, Excessive Hunger, Hair Changes, Heat Intolerance, Hot flashes and New Diabetes. Hematology Not Present- Easy Bruising, Excessive bleeding, Gland problems, HIV and Persistent Infections.   Vitals Ivor Costa MA; 09/20/2014 4:28 PM) 09/20/2014 4:28 PM Weight: 327.8  lb Height: 68in Body Surface Area: 2.67 m Body Mass Index: 49.84 kg/m Temp.: 94F(Temporal)  Pulse: 71 (Regular)  Resp.: 16 (Unlabored)  BP: 128/84 (Sitting, Left Arm, Standard)    Physical Exam (Brigitte Soderberg B. Hassell Done MD; 09/20/2014 5:12 PM) The physical  exam findings are as follows: Note:Obese white female no acute distress.BMI 49.6, blood pressure 128/84, afebrile, heart rate 71 and regular, height 5 feet 8 Head normocephalic, sclera nonicteric pupils are equal round react to light. Nose and throat exam are unremarkable. Neck supple without adenopathy or thyromegaly. Chest clear to auscultation Heart sinus rhythm without murmurs or gallops Abdomen obese and nontender. Prior appendectomy   genitourinary not examined Extremities full range of motionwith no cyanosis edema or clubbing Neuro alert and oriented x3. Motor sensory function grossly intact Psych good cognition and normal communicative skills.    Assessment & Plan Rodman Key B. Hassell Done MD; 09/20/2014 5:15 PM) MORBID OBESITY (278.01  E66.01) Impression: I think that she is a good candidate for Roux-en-Y gastric bypass.  COMPLETE ABDOMINAL ULTRASOUND (76700)--No gallstones  UGI W/ KUB (74241)-small hiatus hernia  I discussed gastric bypass surgery with the patient to include the risks and benefits to include, but not limited to: infection, bleeding, damage to surrounding structures, chronic wound healing, nerve pain, and possible recurrence. The patient voiced understanding and wishes to proceed at this time. She is ready for [name] gastric bypass.  Informed consent completed.

## 2015-01-01 NOTE — Transfer of Care (Signed)
Immediate Anesthesia Transfer of Care Note  Patient: Alexandra Petty  Procedure(s) Performed: Procedure(s) (LRB): LAPAROSCOPIC ROUX-EN-Y GASTRIC BYPASS WITH UPPER ENDOSCOPY (N/A) LAPAROSCOPIC REPAIR OF HIATAL HERNIA (N/A)  Patient Location: PACU  Anesthesia Type: General  Level of Consciousness: sedated, patient cooperative and responds to stimulation  Airway & Oxygen Therapy: Patient Spontanous Breathing and Patient connected to face mask oxgen  Post-op Assessment: Report given to PACU RN and Post -op Vital signs reviewed and stable  Post vital signs: Reviewed and stable  Complications: No apparent anesthesia complications

## 2015-01-01 NOTE — Op Note (Signed)
Alexandra Petty [id number] 1981/11/13 01/01/2015  Preoperative diagnosis: morbid obesity  Postoperative diagnosis: Same   Procedure: Upper endoscopy   Surgeon: Gayland Curry M.D., FACS   Anesthesia: Gen.   Indications for procedure: 34 yo female undergoing a laparoscopic roux en y gastric bypass and an upper endoscopy was requested to evaluate the anastomosis.  Description of procedure: After we have completed the new gastrojejunostomy, I scrubbed out and obtained the Olympus endoscope. I gently placed endoscope in the patient's oropharynx and gently glided it down the esophagus without any difficulty under direct visualization. Once I was in the gastric pouch, I insufflated the pouch was air. The pouch was approximately 5 cm in size. I was able to cannulate and advanced the scope through the gastrojejunostomy. Dr.Martin had placed saline in the upper abdomen. Upon further insufflation of the gastric pouch there was no evidence of bubbles. Upon further inspection of the gastric pouch, the mucosa appeared normal. There is no evidence of any mucosal abnormality. The gastric pouch and Roux limb were decompressed. The width of the gastrojejunal anastomosis was at least 2.5 cm. The scope was withdrawn. The patient tolerated this portion of the procedure well. Please see Dr Earlie Server operative note for details regarding the laparoscopic roux-en-y gastric bypass.  Alexandra Ruff. Redmond Pulling, MD, FACS General, Bariatric, & Minimally Invasive Surgery [facility], [place]

## 2015-01-01 NOTE — Interval H&P Note (Signed)
History and Physical Interval Note:  01/01/2015 7:20 AM  Alexandra Petty  has presented today for surgery, with the diagnosis of Morbid Obesity  The various methods of treatment have been discussed with the patient and family. After consideration of risks, benefits and other options for treatment, the patient has consented to  Procedure(s): LAPAROSCOPIC ROUX-EN-Y GASTRIC BYPASS WITH UPPER ENDOSCOPY (N/A) as a surgical intervention .  The patient's history has been reviewed, patient examined, no change in status, stable for surgery.  I have reviewed the patient's chart and labs.  Questions were answered to the patient's satisfaction.     Yue Glasheen B

## 2015-01-02 ENCOUNTER — Encounter (HOSPITAL_COMMUNITY): Payer: Self-pay | Admitting: Surgery

## 2015-01-02 ENCOUNTER — Inpatient Hospital Stay (HOSPITAL_COMMUNITY): Payer: No Typology Code available for payment source

## 2015-01-02 LAB — CBC WITH DIFFERENTIAL/PLATELET
BASOS ABS: 0 10*3/uL (ref 0.0–0.1)
BASOS PCT: 0 % (ref 0–1)
Eosinophils Absolute: 0 10*3/uL (ref 0.0–0.7)
Eosinophils Relative: 0 % (ref 0–5)
HCT: 40.3 % (ref 36.0–46.0)
HEMOGLOBIN: 13.4 g/dL (ref 12.0–15.0)
LYMPHS ABS: 1.9 10*3/uL (ref 0.7–4.0)
Lymphocytes Relative: 14 % (ref 12–46)
MCH: 31 pg (ref 26.0–34.0)
MCHC: 33.3 g/dL (ref 30.0–36.0)
MCV: 93.3 fL (ref 78.0–100.0)
MONO ABS: 1.4 10*3/uL — AB (ref 0.1–1.0)
MONOS PCT: 10 % (ref 3–12)
NEUTROS PCT: 76 % (ref 43–77)
Neutro Abs: 10.2 10*3/uL — ABNORMAL HIGH (ref 1.7–7.7)
PLATELETS: 356 10*3/uL (ref 150–400)
RBC: 4.32 MIL/uL (ref 3.87–5.11)
RDW: 13 % (ref 11.5–15.5)
WBC: 13.4 10*3/uL — ABNORMAL HIGH (ref 4.0–10.5)

## 2015-01-02 LAB — HEMOGLOBIN AND HEMATOCRIT, BLOOD
HCT: 40.6 % (ref 36.0–46.0)
Hemoglobin: 13.1 g/dL (ref 12.0–15.0)

## 2015-01-02 MED ORDER — IOHEXOL 300 MG/ML  SOLN
50.0000 mL | Freq: Once | INTRAMUSCULAR | Status: AC | PRN
  Administered 2015-01-02: 50 mL via ORAL

## 2015-01-02 NOTE — Plan of Care (Signed)
Problem: Food- and Nutrition-Related Knowledge Deficit (NB-1.1) Goal: Nutrition education Formal process to instruct or train a patient/client in a skill or to impart knowledge to help patients/clients voluntarily manage or modify food choices and eating behavior to maintain or improve health. Outcome: Completed/Met Date Met:  01/02/15 Nutrition Education Note  Received consult for diet education per DROP protocol.   Discussed 2 week post op diet with pt. Emphasized that liquids must be non carbonated, non caffeinated, and sugar free. Fluid goals discussed. Pt to follow up with outpatient bariatric RD for further diet progression after 2 weeks. Multivitamins and minerals also reviewed. Teach back method used, pt expressed understanding, expect good compliance.   Diet: First 2 Weeks  You will see the nutritionist about two (2) weeks after your surgery. The nutritionist will increase the types of foods you can eat if you are handling liquids well:  If you have severe vomiting or nausea and cannot handle clear liquids lasting longer than 1 day, call your surgeon  Protein Shake  Drink at least 2 ounces of shake 5-6 times per day  Each serving of protein shakes (usually 8 - 12 ounces) should have a minimum of:  15 grams of protein  And no more than 5 grams of carbohydrate  Goal for protein each day:  Men = 80 grams per day  Women = 60 grams per day  Protein powder may be added to fluids such as non-fat milk or Lactaid milk or Soy milk (limit to 35 grams added protein powder per serving)   Hydration  Slowly increase the amount of water and other clear liquids as tolerated (See Acceptable Fluids)  Slowly increase the amount of protein shake as tolerated  Sip fluids slowly and throughout the day  May use sugar substitutes in small amounts (no more than 6 - 8 packets per day; i.e. Splenda)   Fluid Goal  The first goal is to drink at least 8 ounces of protein shake/drink per day (or as directed  by the nutritionist); some examples of protein shakes are Syntrax Nectar, Adkins Advantage, EAS Edge HP, and Unjury. See handout from pre-op Bariatric Education Class:  Slowly increase the amount of protein shake you drink as tolerated  You may find it easier to slowly sip shakes throughout the day  It is important to get your proteins in first  Your fluid goal is to drink 64 - 100 ounces of fluid daily  It may take a few weeks to build up to this  32 oz (or more) should be clear liquids  And  32 oz (or more) should be full liquids (see below for examples)  Liquids should not contain sugar, caffeine, or carbonation   Clear Liquids:  Water or Sugar-free flavored water (i.e. Fruit H2O, Propel)  Decaffeinated coffee or tea (sugar-free)  Crystal Lite, Wyler's Lite, Minute Maid Lite  Sugar-free Jell-O  Bouillon or broth  Sugar-free Popsicle: *Less than 20 calories each; Limit 1 per day   Full Liquids:  Protein Shakes/Drinks + 2 choices per day of other full liquids  Full liquids must be:  No More Than 12 grams of Carbs per serving  No More Than 3 grams of Fat per serving  Strained low-fat cream soup  Non-Fat milk  Fat-free Lactaid Milk  Sugar-free yogurt (Dannon Lite & Fit, Greek yogurt)     Amari Burnsworth, MS, RD, LDN Pager: [phone] After Hours Pager: [phone]        

## 2015-01-02 NOTE — Progress Notes (Signed)
Patient ID: Alexandra Petty, female   DOB: 02/16/1981, 34 y.o.   MRN: [id number] [facility] Surgery Progress Note:   1 Day Post-Op  Subjective: Mental status is clear.  Feeling better Objective: Vital signs in last 24 hours: Temp:  [98 F (36.7 C)-98.8 F (37.1 C)] 98.1 F (36.7 C) (01/26 1400) Pulse Rate:  [90-107] 90 (01/26 1400) Resp:  [18-20] 18 (01/26 1400) BP: (134-158)/(74-92) 155/92 mmHg (01/26 1400) SpO2:  [94 %-98 %] 97 % (01/26 1400) Weight:  [313 lb 8 oz (142.203 kg)] 313 lb 8 oz (142.203 kg) (01/26 0612)  Intake/Output from previous day: 01/25 0701 - 01/26 0700 In: 3000 [I.V.:3000] Out: 1225 [Urine:1175; Blood:50] Intake/Output this shift: Total I/O In: 700 [I.V.:700] Out: 600 [Urine:600]  Physical Exam: Work of breathing is normal.  Incisions OK  Lab Results:  Results for orders placed or performed during the hospital encounter of 01/01/15 (from the past 48 hour(s))  Glucose, capillary     Status: None   Collection Time: 01/01/15  5:47 AM  Result Value Ref Range   Glucose-Capillary 90 70 - 99 mg/dL   Comment 1 Notify RN   Pregnancy, urine STAT morning of surgery     Status: None   Collection Time: 01/01/15  5:55 AM  Result Value Ref Range   Preg Test, Ur NEGATIVE NEGATIVE    Comment:        THE SENSITIVITY OF THIS METHODOLOGY IS >20 mIU/mL.   CBC     Status: Abnormal   Collection Time: 01/01/15 12:30 PM  Result Value Ref Range   WBC 24.0 (H) 4.0 - 10.5 K/uL   RBC 4.42 3.87 - 5.11 MIL/uL   Hemoglobin 13.7 12.0 - 15.0 g/dL   HCT 40.8 36.0 - 46.0 %   MCV 92.3 78.0 - 100.0 fL   MCH 31.0 26.0 - 34.0 pg   MCHC 33.6 30.0 - 36.0 g/dL   RDW 13.0 11.5 - 15.5 %   Platelets 323 150 - 400 K/uL  Creatinine, serum     Status: Abnormal   Collection Time: 01/01/15 12:30 PM  Result Value Ref Range   Creatinine, Ser 0.99 0.50 - 1.10 mg/dL   GFR calc non Af Amer 73 (L) >90 mL/min   GFR calc Af Amer 85 (L) >90 mL/min    Comment: (NOTE) The eGFR has been  calculated using the CKD EPI equation. This calculation has not been validated in all clinical situations. eGFR's persistently <90 mL/min signify possible Chronic Kidney Disease.   CBC WITH DIFFERENTIAL     Status: Abnormal   Collection Time: 01/02/15  5:17 AM  Result Value Ref Range   WBC 13.4 (H) 4.0 - 10.5 K/uL   RBC 4.32 3.87 - 5.11 MIL/uL   Hemoglobin 13.4 12.0 - 15.0 g/dL   HCT 40.3 36.0 - 46.0 %   MCV 93.3 78.0 - 100.0 fL   MCH 31.0 26.0 - 34.0 pg   MCHC 33.3 30.0 - 36.0 g/dL   RDW 13.0 11.5 - 15.5 %   Platelets 356 150 - 400 K/uL   Neutrophils Relative % 76 43 - 77 %   Neutro Abs 10.2 (H) 1.7 - 7.7 K/uL   Lymphocytes Relative 14 12 - 46 %   Lymphs Abs 1.9 0.7 - 4.0 K/uL   Monocytes Relative 10 3 - 12 %   Monocytes Absolute 1.4 (H) 0.1 - 1.0 K/uL   Eosinophils Relative 0 0 - 5 %   Eosinophils Absolute 0.0 0.0 - 0.7  K/uL   Basophils Relative 0 0 - 1 %   Basophils Absolute 0.0 0.0 - 0.1 K/uL    Radiology/Results: Dg Ugi W/water Sol Cm  01/02/2015   CLINICAL DATA:  Postop gastric bypass yesterday.  EXAM: WATER SOLUBLE UPPER GI SERIES  TECHNIQUE: Single-column upper GI series was performed using water soluble contrast.  CONTRAST:  55m OMNIPAQUE IOHEXOL 300 MG/ML  SOLN  COMPARISON:  Preoperative study 10/18/2014.  FLUOROSCOPY TIME:  19 seconds.  FINDINGS: The scout abdominal radiograph demonstrates postsurgical changes in the left upper quadrant of the abdomen. The bowel gas pattern appears normal.  The patient swallowed the contrast without difficulty. The esophageal motility appears normal. There is rapid filling and emptying of a small gastric pouch. No extravasation identified. The proximal small bowel fills normally. Despite intermittent fluoroscopic observation over nearly 10 minutes, there was limited progression of the contrast within the distal jejunum, and the distal anastomosis is not clearly identified. There is no extravasation.  IMPRESSION: No demonstrated complication  following gastric Roux-en-Y bypass. The distal anastomosis is not clearly identified.   Electronically Signed   By: BCamie PatienceM.D.   On: 01/02/2015 09:50    Anti-infectives: Anti-infectives    Start     Dose/Rate Route Frequency Ordered Stop   01/01/15 0624  cefOXitin (MEFOXIN) 2 g in dextrose 5 % 50 mL IVPB     2 g100 mL/hr over 30 Minutes Intravenous On call to O.R. 01/01/15 0624 01/01/15 1010      Assessment/Plan: Problem List: Patient Active Problem List   Diagnosis Date Noted  . 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 S/P gastric bypass 01/01/2015  . Pre-menstrual mood disorder 07/05/2014  . Vitamin D deficiency 03/21/2014  . Other malaise and fatigue 03/02/2014  . Obesity, unspecified 03/02/2014  . Elevated blood pressure reading without diagnosis of hypertension 03/02/2014    UGI ok.  Advance diet  1 Day Post-Op    LOS: 1 day   Matt B. MHassell Done MD, FVan [facility] [phone] [phone] 01/02/2015 3:24 PM

## 2015-01-02 NOTE — Progress Notes (Signed)
Patient alert and oriented, Post op day 1.  Provided support and encouragement.  Encouraged pulmonary toilet, ambulation and small sips of liquids when swallow study returned satisfactorily.  All questions answered.  Will continue to monitor. 

## 2015-01-02 NOTE — Care Management Note (Signed)
    Page 1 of 1   01/02/2015     12:09:10 PM CARE MANAGEMENT NOTE 01/02/2015  Patient:  [facility]   Account Number:  [id number]  Date Initiated:  01/02/2015  Documentation initiated by:  Sunday Spillers  Subjective/Objective Assessment:   34 yo female admitted s/p gastric bypass. PTA lived at home with spouse.     Action/Plan:   Home when stable   Anticipated DC Date:  01/04/2015   Anticipated DC Plan:  [place]  CM consult      Choice offered to / List presented to:             Status of service:  Completed, signed off Medicare Important Message given?   (If response is "NO", the following Medicare IM given date fields will be blank) Date Medicare IM given:   Medicare IM given by:   Date Additional Medicare IM given:   Additional Medicare IM given by:    Discharge Disposition:  HOME/SELF CARE  Per UR Regulation:  Reviewed for med. necessity/level of care/duration of stay  If discussed at [place] of Stay Meetings, dates discussed:    Comments:

## 2015-01-03 LAB — CBC WITH DIFFERENTIAL/PLATELET
BASOS ABS: 0 10*3/uL (ref 0.0–0.1)
BASOS PCT: 0 % (ref 0–1)
EOS ABS: 0.2 10*3/uL (ref 0.0–0.7)
EOS PCT: 2 % (ref 0–5)
HEMATOCRIT: 36.5 % (ref 36.0–46.0)
Hemoglobin: 11.7 g/dL — ABNORMAL LOW (ref 12.0–15.0)
Lymphocytes Relative: 21 % (ref 12–46)
Lymphs Abs: 2.2 10*3/uL (ref 0.7–4.0)
MCH: 30.4 pg (ref 26.0–34.0)
MCHC: 32.1 g/dL (ref 30.0–36.0)
MCV: 94.8 fL (ref 78.0–100.0)
Monocytes Absolute: 1.3 10*3/uL — ABNORMAL HIGH (ref 0.1–1.0)
Monocytes Relative: 12 % (ref 3–12)
Neutro Abs: 6.6 10*3/uL (ref 1.7–7.7)
Neutrophils Relative %: 65 % (ref 43–77)
Platelets: 318 10*3/uL (ref 150–400)
RBC: 3.85 MIL/uL — ABNORMAL LOW (ref 3.87–5.11)
RDW: 13.4 % (ref 11.5–15.5)
WBC: 10.3 10*3/uL (ref 4.0–10.5)

## 2015-01-03 NOTE — Discharge Instructions (Signed)

## 2015-01-03 NOTE — Progress Notes (Signed)
Patient alert and oriented, pain is controlled. Patient is tolerating fluids,  advanced to protein shake today, patient tolerated well. Reviewed Gastric Bypass discharge instructions with patient and patient is able to articulate understanding. Provided information on BELT program, Support Group and [facility] outpatient pharmacy. All questions answered, will continue to monitor.    

## 2015-01-03 NOTE — Discharge Summary (Signed)
Physician Discharge Summary  Patient ID: Alexandra Petty MRN: [id number] DOB/AGE: 1981/06/04 34 y.o.  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  UGI on PD 1 looked good.  Begun on PO.  Ready for discharge on PD 2  Consults: none  Significant Diagnostic Studies: UGI    Discharge Exam: Blood pressure 138/77, pulse 87, temperature 98.3 F (36.8 C), temperature source Oral, resp. rate 18, height 5\' 8"  (1.727 m), weight 324 lb 4.8 oz (147.102 kg), last menstrual period 12/25/2014, SpO2 100 %. Incisions OK.  Staples out  Disposition: Final discharge disposition not confirmed  Discharge Instructions    Ambulate hourly while awake    Complete by:  As directed      Call MD for:  difficulty breathing, headache or visual disturbances    Complete by:  As directed      Call MD for:  persistant dizziness or light-headedness    Complete by:  As directed      Call MD for:  persistant nausea and vomiting    Complete by:  As directed      Call MD for:  redness, tenderness, or signs of infection (pain, swelling, redness, odor or green/yellow discharge around incision site)    Complete by:  As directed      Call MD for:  severe uncontrolled pain    Complete by:  As directed      Call MD for:  temperature >101 F    Complete by:  As directed      Diet bariatric full liquid    Complete by:  As directed      Discharge instructions    Complete by:  As directed   Follow bariatric discharge diet     Incentive spirometry    Complete by:  As directed   Perform hourly while awake            Medication List    STOP taking these medications        omeprazole 20 MG capsule  Commonly known as:  PRILOSEC      TAKE these  medications        citalopram 20 MG tablet  Commonly known as:  CELEXA  Take 1 tablet (20 mg total) by mouth daily.     Moxifloxacin HCl 0.5 % Soln  Apply 1 drop to eye 2 (two) times daily.     Vitamin D (Ergocalciferol) 50000 UNITS Caps capsule  Commonly known as:  DRISDOL  Take 1 capsule (50,000 Units total) by mouth every 7 (seven) days.           Follow-up Information    Follow up with Pedro Earls, MD. Go on 01/18/2015.   Specialty:  General Surgery   Why:  10:40 AM post-surgical followup   Contact information:   [place] [address] [phone]       Signed: Pedro Earls 01/03/2015, 3:39 PM

## 2015-01-03 NOTE — Progress Notes (Signed)
Patient alert and oriented, Post op day 2.  Provided support and encouragement.  Encouraged pulmonary toilet, ambulation and small sips of liquids.  All questions answered.  Will continue to monitor. 

## 2015-01-04 ENCOUNTER — Telehealth (HOSPITAL_COMMUNITY): Payer: Self-pay

## 2015-01-04 NOTE — Telephone Encounter (Signed)
Left message to return call 01/04/15 @1643    Made discharge phone call to patient per DROP protocol. Asking the following questions.    1. Do you have someone to care for you now that you are home?   2. Are you having pain now that is not relieved by your pain medication?   3. Are you able to drink the recommended daily amount of fluids (48 ounces minimum/day) and protein (60-80 grams/day) as prescribed by the dietitian or nutritional counselor?   4. Are you taking the vitamins and minerals as prescribed?   5. Do you have the "on call" number to contact your surgeon if you have a problem or question?   6. Are your incisions free of redness, swelling or drainage? (If steri strips, address that these can fall off, shower as tolerated)  7. Have your bowels moved since your surgery?  If not, are you passing gas?   8. Are you up and walking 3-4 times per day?      1. Do you have an appointment made to see your surgeon in the next month?   2. Were you provided your discharge medications before your surgery or before you were discharged from the hospital and are you taking them without problem?   3. Were you provided phone numbers to the clinic/surgeon's office?   4. Did you watch the patient education video module in the (clinic, surgeon's office, etc.) before your surgery?  5. Do you have a discharge checklist that was provided to you in the hospital to reference with instructions on how to take care of yourself after surgery?   6. Did you see a dietitian or nutritional counselor while you were in the hospital?   7. Do you have an appointment to see a dietitian or nutritional counselor in the next month?

## 2015-01-16 ENCOUNTER — Encounter: Payer: No Typology Code available for payment source | Attending: Surgery

## 2015-01-16 DIAGNOSIS — Z713 Dietary counseling and surveillance: Secondary | ICD-10-CM | POA: Insufficient documentation

## 2015-01-16 DIAGNOSIS — E669 Obesity, unspecified: Secondary | ICD-10-CM | POA: Diagnosis not present

## 2015-01-16 DIAGNOSIS — Z6841 Body Mass Index (BMI) 40.0 and over, adult: Secondary | ICD-10-CM | POA: Insufficient documentation

## 2015-01-16 NOTE — Progress Notes (Signed)
Bariatric Class:  Appt start time: 1530 end time:  1630.  2 Week Post-Operative Nutrition Class  Patient was seen on 01/16/15 for Post-Operative Nutrition [facility]   Surgery date: 01/01/15 Surgery type: gastric sleeve Start weight at [facility]: 332 lbs on 10/21/14 Weight today: 304.0 lbs Weight change: 27 lbs   TANITA  BODY COMP RESULTS  12/18/14 01/16/15   BMI (kg/m^2) 50.3 46.2   Fat Mass (lbs) 188 171.0   Fat Free Mass (lbs) 143 133.0   Total Body Water (lbs) 104.5 97.5    The following the learning objectives were met by the patient during this course:  Identifies Phase 3A (Soft, High Proteins) Dietary Goals and will begin from 2 weeks post-operatively to 2 months post-operatively  Identifies appropriate sources of fluids and proteins   States protein recommendations and appropriate sources post-operatively  Identifies the need for appropriate texture modifications, mastication, and bite sizes when consuming solids  Identifies appropriate multivitamin and calcium sources post-operatively  Describes the need for physical activity post-operatively and will follow MD recommendations  States when to call healthcare provider regarding medication questions or post-operative complications  Handouts given during class include:  Phase 3A: Soft, High Protein Diet Handout  Follow-Up Plan: Patient will follow-up at [facility] in 6 weeks for 2 month post-op nutrition visit for diet advancement per MD.

## 2015-01-25 LAB — HM MAMMOGRAPHY

## 2015-02-27 ENCOUNTER — Ambulatory Visit: Payer: No Typology Code available for payment source | Admitting: Dietician

## 2015-03-15 ENCOUNTER — Encounter: Payer: Self-pay | Admitting: *Deleted

## 2015-03-22 ENCOUNTER — Encounter: Payer: No Typology Code available for payment source | Attending: Surgery | Admitting: Dietician

## 2015-03-22 DIAGNOSIS — E669 Obesity, unspecified: Secondary | ICD-10-CM | POA: Insufficient documentation

## 2015-03-22 DIAGNOSIS — Z713 Dietary counseling and surveillance: Secondary | ICD-10-CM | POA: Diagnosis not present

## 2015-03-22 DIAGNOSIS — Z6841 Body Mass Index (BMI) 40.0 and over, adult: Secondary | ICD-10-CM | POA: Insufficient documentation

## 2015-03-22 NOTE — Progress Notes (Signed)
  Follow-up visit:  10 Weeks Post-Operative Sleeve Gastrectomy Surgery  Medical Nutrition Therapy:  Appt start time: 9675 end time:  9163.  Primary concerns today: Post-operative Bariatric Surgery Nutrition Management. Returns with a 26.5 lb weight loss. Eating more salmon and shrimp than meat and chicken since it works better. Protein shakes give her a "gagging sensation" towards the end of them.   Surgery date: 01/01/15 Surgery type: gastric sleeve Start weight at [facility]: 332 lbs on 10/21/14 Weight today: 277.5 lbs  Weight change: 26.5 lbs, 49 lbs fat mass loss Total weight loss: 54.5 lbs   TANITA  BODY COMP RESULTS  12/18/14 01/16/15 03/22/15   BMI (kg/m^2) 50.3 46.2 42.2   Fat Mass (lbs) 188 171.0 122.0   Fat Free Mass (lbs) 143 133.0 155.5   Total Body Water (lbs) 104.5 97.5 114.0    Preferred Learning Style:   No preference indicated   Learning Readiness:   Ready  24-hr recall: B (AM): Premier Protein (30 g) Snk (AM): [place] yogurt (15 g) L (PM): salmon and green beans (7 g) Snk (PM): beef stick or cheese (7 g) D (PM): Premier Protein (30 g) Snk (PM): none  Fluid intake: 22 oz protein shake and 64 oz water Estimated total protein intake: 89 g   Medications: see list Supplementation: taking  Using straws: No Drinking while eating: No Hair loss: No Carbonated beverages: No N/V/D/C: constipation - taking Miralax as needed Dumping syndrome: No  Recent physical activity:  Workout 45 minute on treadmill 6 incline and 3 speed and 15 minutes on bike and weights 3 x week  Progress Towards Goal(s):  In progress.  Handouts given during visit include:  Phase 3B High Protein and Non-Starchy Vegetables   Nutritional Diagnosis:  [place]-3.3 Overweight/obesity related to past poor dietary habits and physical inactivity as evidenced by patient w/ recent sleeve gastrectomy surgery following dietary guidelines for continued weight loss.    Intervention:  Nutrition education/diet  advancement. Goals:  Follow Phase 3B: High Protein + Non-Starchy Vegetables  Eat 3-6 small meals/snacks, every 3-5 hrs  Increase lean protein foods to meet 60g goal  Increase fluid intake to 64oz +  Avoid drinking 15 minutes before, during and 30 minutes after eating  Aim for >30 min of physical activity daily  Feel free to add hot tea or coffee back   Teaching Method Utilized:  Visual Auditory Hands on  Barriers to learning/adherence to lifestyle change: none  Demonstrated degree of understanding via:  Teach Back   Monitoring/Evaluation:  Dietary intake, exercise, and body weight. Follow up in 1 months for 4.5 month post-op visit.

## 2015-03-22 NOTE — Patient Instructions (Signed)
Goals:  Follow Phase 3B: High Protein + Non-Starchy Vegetables  Eat 3-6 small meals/snacks, every 3-5 hrs  Increase lean protein foods to meet 60g goal  Increase fluid intake to 64oz +  Avoid drinking 15 minutes before, during and 30 minutes after eating  Aim for >30 min of physical activity daily  Feel free to add hot tea or coffee back  Surgery date: 01/01/15 Surgery type: gastric sleeve Start weight at [facility]: 332 lbs on 10/21/14 Weight today: 277.5 lbs Weight change: 26.5 lbs, 49 lbs fat mass loss Total weight loss: 54.5 lbs   TANITA  BODY COMP RESULTS  12/18/14 01/16/15 03/22/15   BMI (kg/m^2) 50.3 46.2 42.2   Fat Mass (lbs) 188 171.0 122.0   Fat Free Mass (lbs) 143 133.0 155.5   Total Body Water (lbs) 104.5 97.5 114.0

## 2015-04-03 ENCOUNTER — Encounter: Payer: Self-pay | Admitting: Family Medicine

## 2015-05-01 ENCOUNTER — Encounter: Payer: Self-pay | Admitting: Dietician

## 2015-05-01 ENCOUNTER — Encounter: Payer: 59 | Attending: Surgery | Admitting: Dietician

## 2015-05-01 DIAGNOSIS — Z713 Dietary counseling and surveillance: Secondary | ICD-10-CM | POA: Diagnosis not present

## 2015-05-01 DIAGNOSIS — Z6841 Body Mass Index (BMI) 40.0 and over, adult: Secondary | ICD-10-CM | POA: Diagnosis not present

## 2015-05-01 DIAGNOSIS — E669 Obesity, unspecified: Secondary | ICD-10-CM | POA: Insufficient documentation

## 2015-05-01 NOTE — Patient Instructions (Addendum)
Goals:  Follow Phase 3B: High Protein + Non-Starchy Vegetables  Eat 3-6 small meals/snacks, every 3-5 hrs  Increase lean protein foods to meet 60g goal  Increase fluid intake to 64oz +  Avoid drinking 15 minutes before, during and 30 minutes after eating  Aim for >30 min of physical activity daily  If you want to add carbs, eat protein first, then non starchy vegetables, and then have carbs if you have room   Surgery date: 01/01/15 Surgery type: RYGB Start weight at [facility]: 332 lbs on 10/21/14 Weight today: 261.5 Weight change: 16 lbs Total weight loss: 70.5 lbs   TANITA  BODY COMP RESULTS  12/18/14 01/16/15 03/22/15 05/01/15   BMI (kg/m^2) 50.3 46.2 42.2 39.7   Fat Mass (lbs) 188 171.0 122.0 119.0   Fat Free Mass (lbs) 143 133.0 155.5 142.0   Total Body Water (lbs) 104.5 97.5 114.0 104.0
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
## 2015-05-22 ENCOUNTER — Other Ambulatory Visit: Payer: Self-pay | Admitting: Family Medicine

## 2015-06-21 ENCOUNTER — Emergency Department (HOSPITAL_COMMUNITY)
Admission: EM | Admit: 2015-06-21 | Discharge: 2015-06-21 | Disposition: A | Discharge status: Home / Self Care | Payer: 59 | Attending: Emergency Medicine | Admitting: Emergency Medicine

## 2015-06-21 ENCOUNTER — Encounter (HOSPITAL_COMMUNITY): Payer: Self-pay | Admitting: Emergency Medicine

## 2015-06-21 DIAGNOSIS — Y998 Other external cause status: Secondary | ICD-10-CM | POA: Diagnosis not present

## 2015-06-21 DIAGNOSIS — Z8719 Personal history of other diseases of the digestive system: Secondary | ICD-10-CM | POA: Insufficient documentation

## 2015-06-21 DIAGNOSIS — X17XXXA Contact with hot engines, machinery and tools, initial encounter: Secondary | ICD-10-CM | POA: Diagnosis not present

## 2015-06-21 DIAGNOSIS — L03113 Cellulitis of right upper limb: Secondary | ICD-10-CM | POA: Insufficient documentation

## 2015-06-21 DIAGNOSIS — Y9389 Activity, other specified: Secondary | ICD-10-CM | POA: Diagnosis not present

## 2015-06-21 DIAGNOSIS — Y9289 Other specified places as the place of occurrence of the external cause: Secondary | ICD-10-CM | POA: Insufficient documentation

## 2015-06-21 DIAGNOSIS — T2220XA Burn of second degree of shoulder and upper limb, except wrist and hand, unspecified site, initial encounter: Secondary | ICD-10-CM

## 2015-06-21 DIAGNOSIS — F419 Anxiety disorder, unspecified: Secondary | ICD-10-CM | POA: Diagnosis not present

## 2015-06-21 DIAGNOSIS — T22211A Burn of second degree of right forearm, initial encounter: Secondary | ICD-10-CM | POA: Diagnosis not present

## 2015-06-21 DIAGNOSIS — T22011A Burn of unspecified degree of right forearm, initial encounter: Secondary | ICD-10-CM | POA: Diagnosis present

## 2015-06-21 MED ORDER — MUPIROCIN CALCIUM 2 % EX CREA: 1.0000 "application " | TOPICAL_CREAM | Freq: Two times a day (BID) | CUTANEOUS | Status: DC

## 2015-06-21 MED ORDER — CLINDAMYCIN PALMITATE HCL 75 MG/5ML PO SOLR: 300.0000 mg | Freq: Three times a day (TID) | ORAL | Status: DC
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
## 2015-08-01 ENCOUNTER — Ambulatory Visit (INDEPENDENT_AMBULATORY_CARE_PROVIDER_SITE_OTHER): Payer: 59 | Admitting: Family Medicine

## 2015-08-01 VITALS — BP 130/80 | HR 80 | Temp 98.2°F | Resp 16 | Ht 68.0 in | Wt 231.0 lb

## 2015-08-01 DIAGNOSIS — R059 Cough, unspecified: Secondary | ICD-10-CM

## 2015-08-01 DIAGNOSIS — R05 Cough: Secondary | ICD-10-CM

## 2015-08-01 DIAGNOSIS — R0981 Nasal congestion: Secondary | ICD-10-CM | POA: Diagnosis not present

## 2015-08-01 DIAGNOSIS — J0100 Acute maxillary sinusitis, unspecified: Secondary | ICD-10-CM | POA: Diagnosis not present

## 2015-08-01 MED ORDER — AZITHROMYCIN 200 MG/5ML PO SUSR
ORAL | Status: DC
Start: 2015-08-01 — End: 2016-09-22

## 2015-08-01 MED ORDER — HYDROCOD POLST-CPM POLST ER 10-8 MG/5ML PO SUER: 5.0000 mL | Freq: Every evening | ORAL | Status: DC | PRN

## 2015-08-01 MED ORDER — BENZONATATE 100 MG PO CAPS: 200.0000 mg | ORAL_CAPSULE | Freq: Two times a day (BID) | ORAL | Status: DC | PRN

## 2015-08-01 NOTE — Progress Notes (Signed)
Chief Complaint:  Chief Complaint  Patient presents with  . Sinus Problem    HPI: Alexandra Petty is a 34 y.o. female who reports to [facility] today complaining of 20 day history of sinus congestion and cough, productive and green, no fevers or chills. Has tried afrin without relief. No SOB or wheezing.  She is going to [facility] next week and wants to be well. Has no asthma or allergies. Denies CP or SOB. Has had recent bariatric surgery so hard o swallow pills, has lost 100 lbs in 7 months.    Past Medical History  Diagnosis Date  . Anxiety   . GERD (gastroesophageal reflux disease)    Past Surgical History  Procedure Laterality Date  . Appendectomy    . Gastric roux-en-y N/A 01/01/2015    Procedure: LAPAROSCOPIC ROUX-EN-Y GASTRIC BYPASS WITH UPPER ENDOSCOPY;  Surgeon: Pedro Earls, MD;  Location: [facility];  Service: General;  Laterality: N/A;  . Hiatal hernia repair N/A 01/01/2015    Procedure: LAPAROSCOPIC REPAIR OF HIATAL HERNIA;  Surgeon: Pedro Earls, MD;  Location: [facility];  Service: General;  Laterality: N/A;  . Gastric bypass     Social History   Social History  . Marital Status: Married    Spouse Name: N/A  . Number of Children: N/A  . Years of Education: 12+   Occupational History  . Accountant    Social History Main Topics  . Smoking status: Never Smoker   . Smokeless tobacco: Never Used  . Alcohol Use: Yes  . Drug Use: No  . Sexual Activity: Not Asked   Other Topics Concern  . None   Social History Narrative   Family History  Problem Relation Age of Onset  . Arthritis Mother   . Alcohol abuse Father   . Diabetes Father   . Heart disease Father   . Hyperlipidemia Father   . Hypertension Father    No Known Allergies Prior to Admission medications   Medication Sig Start Date End Date Taking? Authorizing Provider  citalopram (CELEXA) 20 MG tablet TAKE ONE TABLET BY MOUTH DAILY.. "OV NEEDED FOR FURTHER REFILLS" 05/23/15  Yes Mancel Bale, PA-C    Vitamin D, Ergocalciferol, (DRISDOL) 50000 UNITS CAPS capsule Take 1 capsule (50,000 Units total) by mouth every 7 (seven) days. 11/20/14  Yes Barton Fanny, MD  azithromycin [facility]) 200 MG/5ML suspension Take 12.5 ml po the first day, then 6 ml p daily for the next 5 days 08/01/15   Tyreisha Ungar P Richetta Cubillos, DO  benzonatate (TESSALON) 100 MG capsule Take 2 capsules (200 mg total) by mouth 2 (two) times daily as needed. 08/01/15   Oshen Wlodarczyk P Ollin Hochmuth, DO  chlorpheniramine-HYDROcodone (TUSSIONEX PENNKINETIC ER) 10-8 MG/5ML SUER Take 5 mLs by mouth at bedtime as needed for cough. 08/01/15   Kailena Lubas P Madelynne Lasker, DO  Moxifloxacin HCl 0.5 % SOLN Apply 1 drop to eye 2 (two) times daily. Patient not taking: Reported on 12/26/2014 10/30/14   Darreld Mclean, MD  mupirocin cream (BACTROBAN) 2 % Apply 1 application topically 2 (two) times daily. Patient not taking: Reported on 08/01/2015 06/21/15   [facility] Muthersbaugh, PA-C     ROS: The patient denies fevers, chills, night sweats, unintentional weight loss, chest pain, palpitations, wheezing, dyspnea on exertion, nausea, vomiting, abdominal pain, dysuria, hematuria, melena, numbness, weakness, or tingling.   All other systems have been reviewed and were otherwise negative with the exception of those mentioned in the HPI and as above.  PHYSICAL EXAM: Filed Vitals:   08/01/15 1857  BP: 130/80  Pulse: 80  Temp: 98.2 F (36.8 C)  Resp: 16   Body mass index is 35.13 kg/(m^2).   General: Alert, no acute distress HEENT:  Normocephalic, atraumatic, oropharynx patent. EOMI, PERRLA Erythematous throat, no exudates, TM normal, + sinus tenderness, + erythematous/boggy nasal mucosa Cardiovascular:  Regular rate and rhythm, no rubs murmurs or gallops.  No Carotid bruits, radial pulse intact. No pedal edema.  No cyanosis, no use of accessory musculature Abdominal: No organomegaly, abdomen is soft and non-tender, positive bowel sounds. No masses. Skin: No rashes. Neurologic: Facial  musculature symmetric. Psychiatric: Patient acts appropriately throughout our interaction. Lymphatic: No cervical or submandibular lymphadenopathy Musculoskeletal: Gait intact. No edema, tenderness   LABS: Results for orders placed or performed in visit on 03/15/15  HM MAMMOGRAPHY  Result Value Ref Range   HM Mammogram      no mammographic evidence of malignancy. Routine mammographic evaluation in 5 years is recommended.       EKG/XRAY:   Primary read interpreted by Dr. Marin Comment at [facility].   ASSESSMENT/PLAN: Encounter Diagnoses  Name Primary?  . Acute maxillary sinusitis, recurrence not specified Yes  . Cough   . Nasal congestion    Rx azithromycin Rx Tessalon perles , tussionex OTC nasacort FU prn   Gross sideeffects, risk and benefits, and alternatives of medications d/w patient. Patient is aware that all medications have potential sideeffects and we are unable to predict every sideeffect or drug-drug interaction that may occur.  Westley Blass DO  08/01/2015 7:21 PM

## 2015-08-01 NOTE — Patient Instructions (Signed)

## 2015-09-19 ENCOUNTER — Other Ambulatory Visit: Payer: Self-pay | Admitting: Physician Assistant

## 2015-10-09 ENCOUNTER — Other Ambulatory Visit: Payer: Self-pay | Admitting: Physician Assistant

## 2015-10-13 ENCOUNTER — Other Ambulatory Visit: Payer: Self-pay | Admitting: Physician Assistant

## 2016-01-11 IMAGING — RF DG UGI W/ GASTROGRAFIN
14 of 24 series · 14 of 24 positions shown · IV contrast (omnipaque)
Comparison: Preoperative study 10/18/2014.

FLUOROSCOPY TIME:  19 seconds.

CLINICAL DATA: Postop gastric bypass yesterday.

EXAM:
WATER SOLUBLE UPPER GI SERIES
TECHNIQUE: Single-column upper GI series was performed using water soluble
contrast.
CONTRAST:  50mL OMNIPAQUE IOHEXOL 300 MG/ML  SOLN

[Series 1: run · 1 of 1 slices shown (1 of 13)]
[im 1/1]
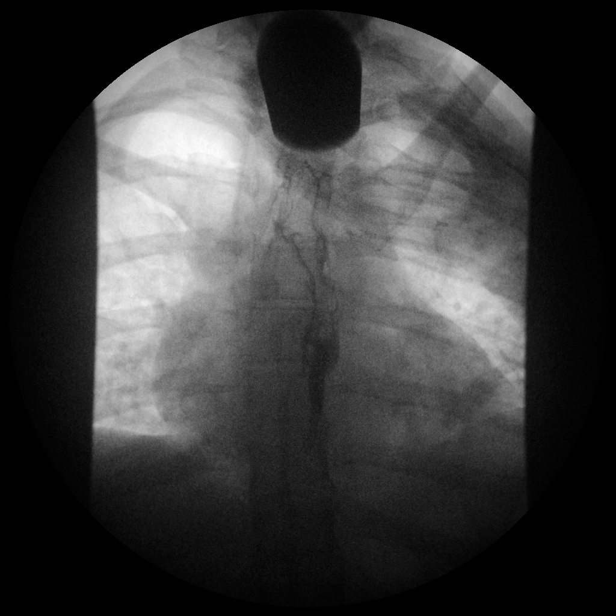

[Series 3: run · 1 of 1 slices shown (2 of 13)]
[im 1/1]
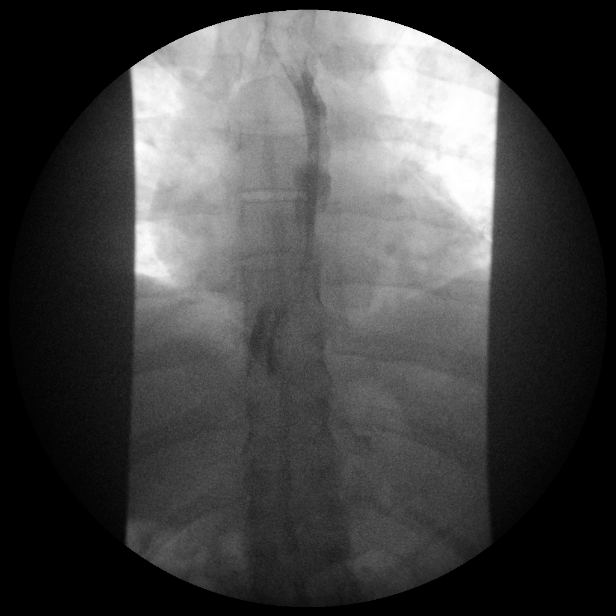

[Series 5: run · 1 of 1 slices shown (3 of 13)]
[im 1/1]
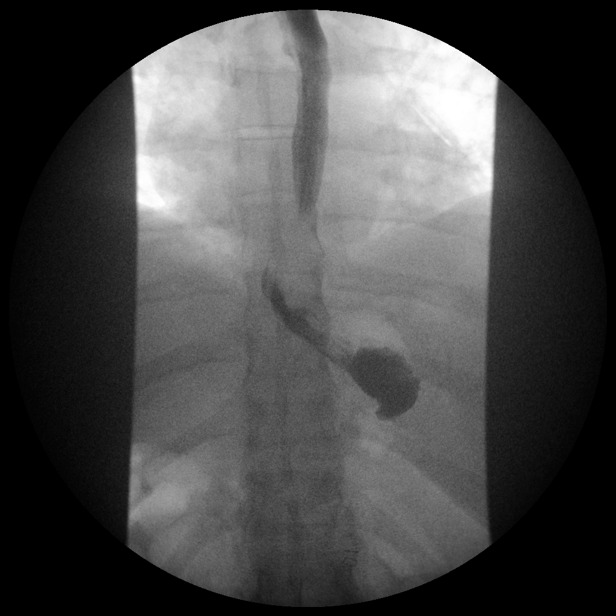

[Series 7: run · 1 of 1 slices shown (4 of 13)]
[im 1/1]
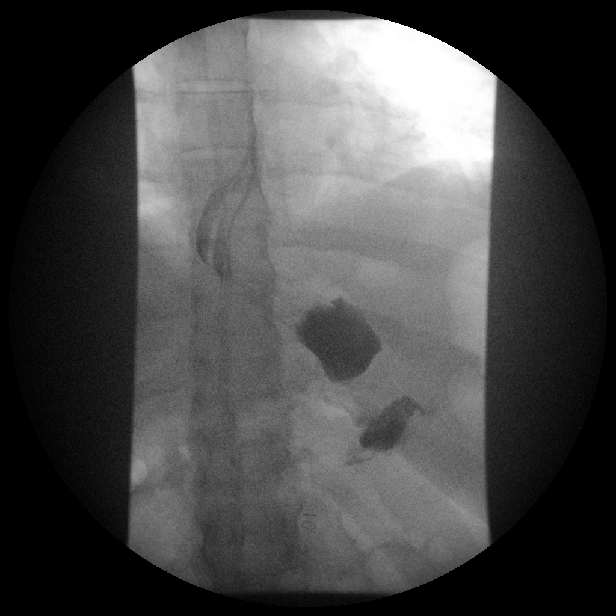

[Series 8: run · 1 of 1 slices shown (5 of 13)]
[im 1/1]
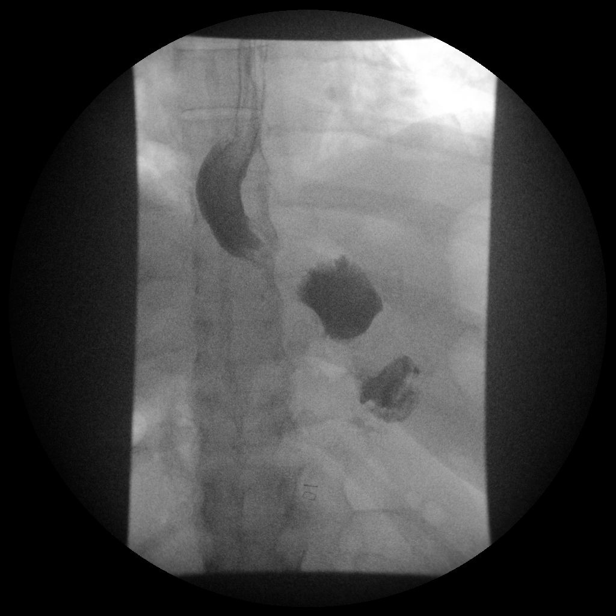

[Series 10: run · 1 of 1 slices shown (6 of 13)]
[im 1/1]
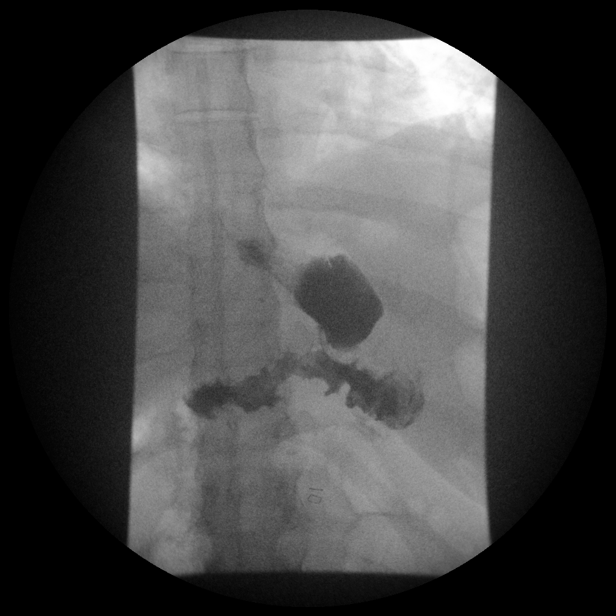

[Series 12: run · 1 of 1 slices shown (7 of 13)]
[im 1/1]
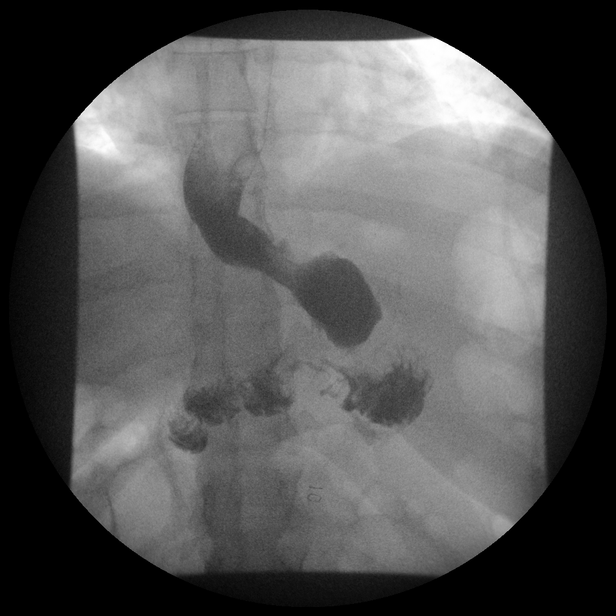

[Series 13: run · 1 of 1 slices shown (8 of 13)]
[im 1/1]
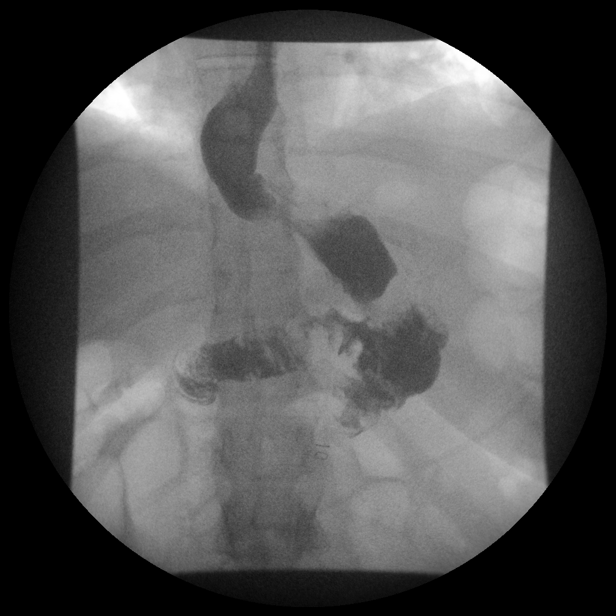

[Series 15: run · 1 of 1 slices shown (9 of 13)]
[im 1/1]
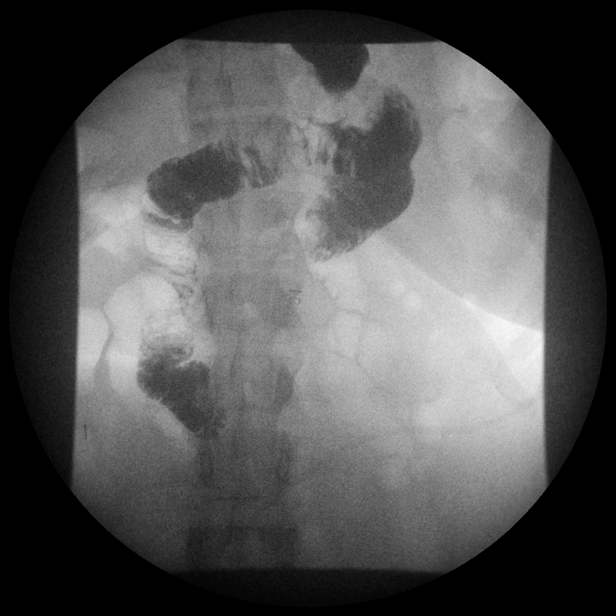

[Series 17: run · 1 of 1 slices shown (10 of 13)]
[im 1/1]
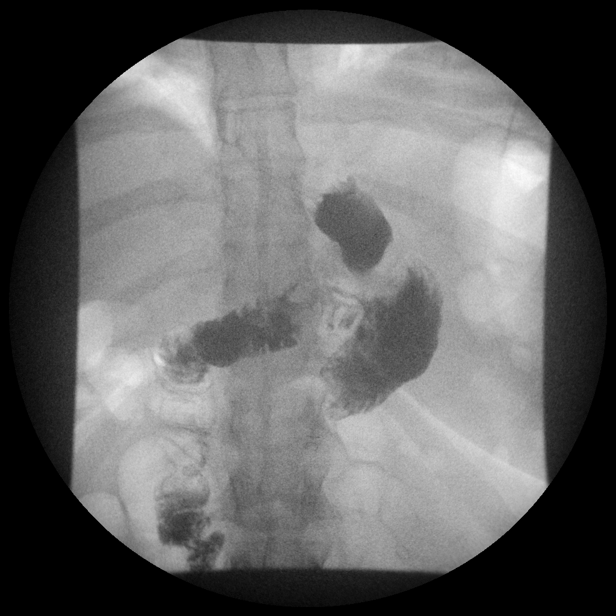

[Series 19: run · 1 of 1 slices shown (11 of 13)]
[im 1/1]
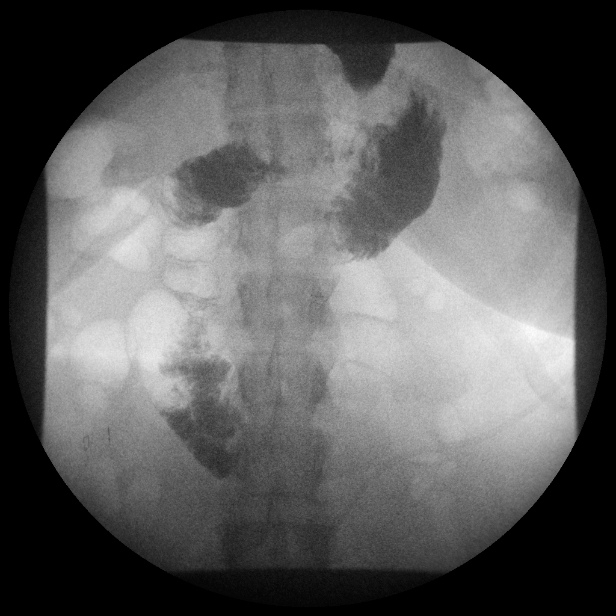

[Series 20: run · 1 of 1 slices shown (12 of 13)]
[im 1/1]
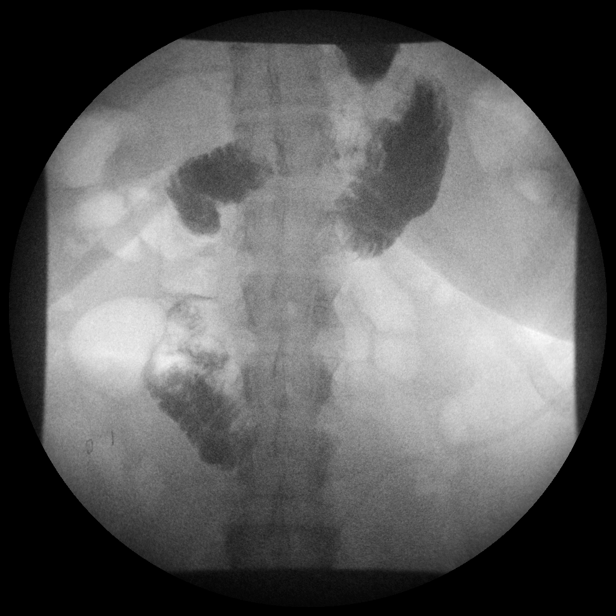

[Series 22: run · 1 of 1 slices shown (13 of 13)]
[im 1/1]
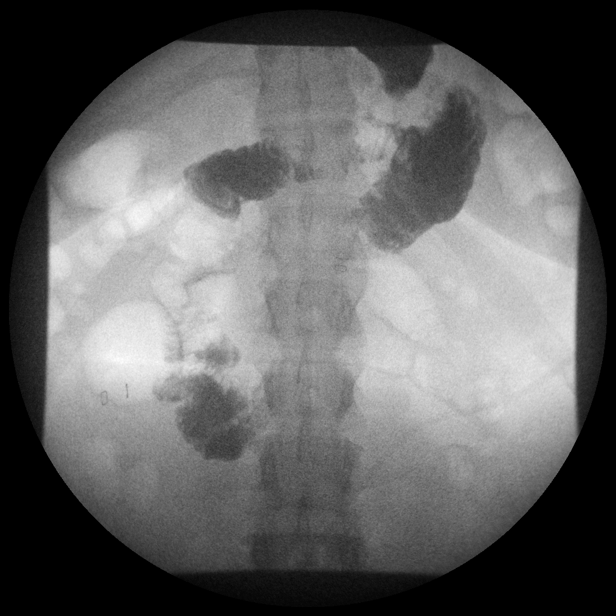

[Series 1001: view not recorded · 0.20mm/px · 1 of 1 slices shown]
[im 1/1]
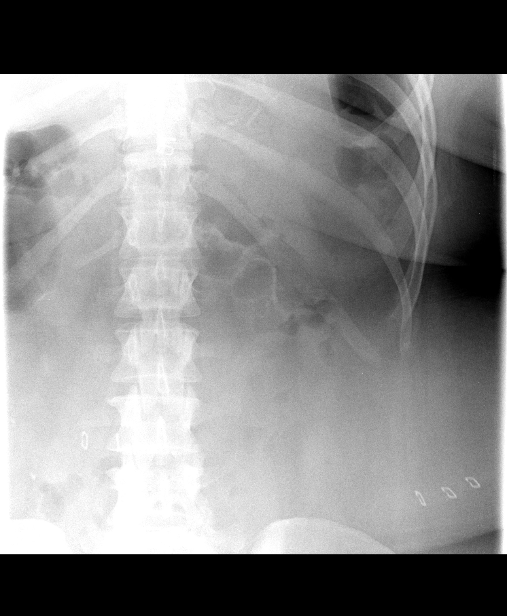

[14 of 24 positions shown; findings below may reference images not displayed]

FINDINGS: The scout abdominal radiograph demonstrates postsurgical changes in
the left upper quadrant of the abdomen. The bowel gas pattern
appears normal.

The patient swallowed the contrast without difficulty. The
esophageal motility appears normal. There is rapid filling and
emptying of a small gastric pouch. No extravasation identified. The
proximal small bowel fills normally. Despite intermittent
fluoroscopic observation over nearly 10 minutes, there was limited
progression of the contrast within the distal jejunum, and the
distal anastomosis is not clearly identified. There is no
extravasation.
IMPRESSION: No demonstrated complication following gastric Roux-en-Y bypass. The
distal anastomosis is not clearly identified.

## 2016-09-09 ENCOUNTER — Encounter (HOSPITAL_COMMUNITY): Payer: Self-pay

## 2016-09-22 ENCOUNTER — Ambulatory Visit (INDEPENDENT_AMBULATORY_CARE_PROVIDER_SITE_OTHER): Payer: BLUE CROSS/BLUE SHIELD | Admitting: Family Medicine

## 2016-09-22 ENCOUNTER — Other Ambulatory Visit: Payer: Self-pay | Admitting: Physician Assistant

## 2016-09-22 VITALS — BP 126/80 | HR 76 | Temp 98.3°F | Resp 17 | Ht 67.5 in | Wt 196.0 lb

## 2016-09-22 DIAGNOSIS — Z6830 Body mass index (BMI) 30.0-30.9, adult: Secondary | ICD-10-CM

## 2016-09-22 DIAGNOSIS — Z9884 Bariatric surgery status: Secondary | ICD-10-CM | POA: Diagnosis not present

## 2016-09-22 DIAGNOSIS — Z23 Encounter for immunization: Secondary | ICD-10-CM

## 2016-09-22 DIAGNOSIS — R5382 Chronic fatigue, unspecified: Secondary | ICD-10-CM | POA: Diagnosis not present

## 2016-09-22 DIAGNOSIS — E6609 Other obesity due to excess calories: Secondary | ICD-10-CM

## 2016-09-22 DIAGNOSIS — F411 Generalized anxiety disorder: Secondary | ICD-10-CM

## 2016-09-22 DIAGNOSIS — E66811 Obesity, class 1: Secondary | ICD-10-CM

## 2016-09-22 LAB — COMPREHENSIVE METABOLIC PANEL
ALT: 9 U/L (ref 6–29)
AST: 13 U/L (ref 10–30)
Albumin: 3.9 g/dL (ref 3.6–5.1)
Alkaline Phosphatase: 67 U/L (ref 33–115)
BILIRUBIN TOTAL: 0.5 mg/dL (ref 0.2–1.2)
BUN: 9 mg/dL (ref 7–25)
CO2: 24 mmol/L (ref 20–31)
CREATININE: 0.68 mg/dL (ref 0.50–1.10)
Calcium: 9 mg/dL (ref 8.6–10.2)
Chloride: 106 mmol/L (ref 98–110)
GLUCOSE: 82 mg/dL (ref 65–99)
Potassium: 4.4 mmol/L (ref 3.5–5.3)
SODIUM: 139 mmol/L (ref 135–146)
Total Protein: 7 g/dL (ref 6.1–8.1)

## 2016-09-22 LAB — TSH: TSH: 1.45 mIU/L

## 2016-09-22 LAB — CBC
HCT: 40.4 % (ref 35.0–45.0)
HEMOGLOBIN: 13.8 g/dL (ref 11.7–15.5)
MCH: 32.5 pg (ref 27.0–33.0)
MCHC: 34.2 g/dL (ref 32.0–36.0)
MCV: 95.1 fL (ref 80.0–100.0)
MPV: 8.9 fL (ref 7.5–12.5)
Platelets: 340 10*3/uL (ref 140–400)
RBC: 4.25 MIL/uL (ref 3.80–5.10)
RDW: 12.5 % (ref 11.0–15.0)
WBC: 8.4 10*3/uL (ref 3.8–10.8)

## 2016-09-22 LAB — LIPID PANEL
Cholesterol: 139 mg/dL (ref 125–200)
HDL: 81 mg/dL (ref >=46)
LDL CALC: 46 mg/dL (ref <130)
Total CHOL/HDL Ratio: 1.7 Ratio (ref <=5.0)
Triglycerides: 59 mg/dL (ref <150)
VLDL: 12 mg/dL (ref <30)

## 2016-09-22 LAB — VITAMIN B12: VITAMIN B 12: 313 pg/mL (ref 200–1100)

## 2016-09-22 LAB — FERRITIN: Ferritin: 28 ng/mL (ref 10–154)

## 2016-09-22 LAB — FOLATE: Folate: 7.8 ng/mL (ref >5.4)

## 2016-09-22 MED ORDER — CITALOPRAM HYDROBROMIDE 20 MG PO TABS: ORAL_TABLET | ORAL | 6 refills | Status: DC

## 2016-09-22 NOTE — Patient Instructions (Signed)
     IF you received an x-ray today, you will receive an invoice from [facility]. Please contact [facility] at [phone] with questions or concerns regarding your invoice.   IF you received labwork today, you will receive an invoice from [facility]. Please contact [facility] at [phone] with questions or concerns regarding your invoice.   Our billing staff will not be able to assist you with questions regarding bills from these companies.  You will be contacted with the lab results as soon as they are available. The fastest way to get your results is to activate your My Chart account. Instructions are located on the last page of this paperwork. If you have not heard from us regarding the results in 2 weeks, please contact this office.      

## 2016-09-22 NOTE — Progress Notes (Signed)
Chief Complaint  Patient presents with  . Follow-up    lack of vitamin D     HPI  Obesity  Body mass index is 30.24 kg/m. She is s/p gastric bypass [name] surgery in 2016. Her surgery ws January 2016. She reports that she is only eating a small portion of food and is trying to get her 60g of protein a day. She is drinking fluids as well. She takes supplements as well. Her presurgery weight was 354 lbs Wt Readings from Last 3 Encounters:  09/22/16 196 lb (88.9 kg)  08/01/15 231 lb (104.8 kg)  06/21/15 246 lb 9.6 oz (111.9 kg)   Anxiety She reports that she ran out of her Celexa and needed an office visit to get a refill.  She started the Celexa prior to her gastric bypass  She noted that she would also get PMDD and was started on Celexa for it She has been on it for 2 years but because she had to stop her celexa after her surgery.  She would like to resume her celexa.  Chronic Fatigue She reports that she feels like her legs hurt and she gets tremors in her fingers and just feels very tired.  She has been getting tired early at night and despite getting enough sleep she would still feel tired with pain in her legs.    Past Medical History:  Diagnosis Date  . Anxiety   . GERD (gastroesophageal reflux disease)     Current Outpatient Prescriptions  Medication Sig Dispense Refill  . Vitamin D, Ergocalciferol, (DRISDOL) 50000 UNITS CAPS capsule Take 1 capsule (50,000 Units total) by mouth every 7 (seven) days. 10 capsule 1  . citalopram (CELEXA) 20 MG tablet TAKE 1 TABLET BY MOUTH DAILY. 30 tablet 6   No current facility-administered medications for this visit.     Allergies: No Known Allergies  Past Surgical History:  Procedure Laterality Date  . APPENDECTOMY    . GASTRIC BYPASS    . GASTRIC ROUX-EN-Y N/A 01/01/2015   Procedure: LAPAROSCOPIC ROUX-EN-Y GASTRIC BYPASS WITH UPPER ENDOSCOPY;  Surgeon: Pedro Earls, MD;  Location: [facility];  Service: General;   Laterality: N/A;  . HIATAL HERNIA REPAIR N/A 01/01/2015   Procedure: LAPAROSCOPIC REPAIR OF HIATAL HERNIA;  Surgeon: Pedro Earls, MD;  Location: [facility];  Service: General;  Laterality: N/A;    Social History   Social History  . Marital status: Married    Spouse name: N/A  . Number of children: N/A  . 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 Smoking status: Never Smoker  . Smokeless tobacco: Never Used  . Alcohol use Yes  . Drug use: No  . Sexual activity: Not Asked   Other Topics Concern  . None   Social History Narrative  . None    ROS  Objective: Vitals:   09/22/16 0913  BP: 126/80  Pulse: 76  Resp: 17  Temp: 98.3 F (36.8 C)  TempSrc: Oral  SpO2: 99%  Weight: 196 lb (88.9 kg)  Height: 5' 7.5" (1.715 m)    Physical Exam General: alert, oriented, in NAD Head: normocephalic, atraumatic, no sinus tenderness Eyes: EOM intact, no scleral icterus or conjunctival injection Ears: TM clear bilaterally Throat: no pharyngeal exudate or erythema Lymph: no posterior auricular, submental or cervical lymph adenopathy Heart: normal rate, normal sinus rhythm, no murmurs Lungs: clear to auscultation bilaterally, no wheezing  Assessment and Plan Alexandra Petty was seen today for follow-up.  Diagnoses and all orders for this visit:  S/P gastric bypass- will check nutrition labs  Advised to follow up with gastric center -     CBC -     Comprehensive metabolic panel -     Lipid panel -     Vitamin B12 -     VITAMIN D 25 Hydroxy (Vit-D Deficiency, Fractures) -     TSH -     Folate  Chronic fatigue- could be nutritional or metabolic -     CBC -     Comprehensive metabolic panel -     Vitamin B12 -     VITAMIN D 25 Hydroxy (Vit-D Deficiency, Fractures) -     TSH -     Folate -     Ferritin  GAD (generalized anxiety disorder)- resume celexa -     citalopram (CELEXA) 20 MG tablet; TAKE 1 TABLET  BY MOUTH DAILY.  Class 1 obesity due to excess calories with serious comorbidity and body mass index (BMI) of 30.0 to 30.9 in adult-  Discussed her dramatic weight loss following gastric bypass Pt was morbidly obese and is now almost in the overweight range  Need for prophylactic vaccination and inoculation against influenza -     Flu Vaccine QUAD 36+ mos IM        Alexandra Petty

## 2016-09-23 ENCOUNTER — Encounter: Payer: Self-pay | Admitting: Family Medicine

## 2016-09-23 LAB — VITAMIN D 25 HYDROXY (VIT D DEFICIENCY, FRACTURES): VIT D 25 HYDROXY: 27 ng/mL — AB (ref 30–100)

## 2016-09-23 MED ORDER — VITAMIN D (ERGOCALCIFEROL) 1.25 MG (50000 UNIT) PO CAPS: 50000.0000 [IU] | ORAL_CAPSULE | ORAL | 3 refills | Status: DC

## 2016-10-10 ENCOUNTER — Encounter (HOSPITAL_COMMUNITY): Payer: Self-pay | Admitting: Emergency Medicine

## 2016-10-10 ENCOUNTER — Ambulatory Visit (HOSPITAL_COMMUNITY)
Admission: EM | Admit: 2016-10-10 | Discharge: 2016-10-10 | Disposition: A | Discharge status: Home / Self Care | Payer: BLUE CROSS/BLUE SHIELD | Attending: Family Medicine | Admitting: Family Medicine

## 2016-10-10 DIAGNOSIS — H2021 Lens-induced iridocyclitis, right eye: Secondary | ICD-10-CM | POA: Diagnosis not present

## 2016-10-10 MED ORDER — TOBRAMYCIN-DEXAMETHASONE 0.3-0.1 % OP OINT: 1.0000 "application " | TOPICAL_OINTMENT | Freq: Three times a day (TID) | OPHTHALMIC | 0 refills | Status: DC

## 2016-10-10 NOTE — ED Triage Notes (Signed)
Here for right eye irritation onset today associated w/redness  States she uses contacts but today she is wearing glasses  Denies inj/trauma, blurred vision  A&O x4... NAD

## 2016-10-10 NOTE — ED Provider Notes (Signed)
[facility]    [name]: [id number] Arrival date & time: 10/10/16  1819     History   Chief Complaint Chief Complaint  Patient presents with  . Eye Problem    HPI Alexandra Petty is a 35 y.o. female.   This is a 35 year old woman who presents with right eye irritation and redness. She is use contacts in the past but is wearing glasses tonight.  She's had this problem in the past.  Patient works at [employer] in the [employer].      Past Medical History:  Diagnosis Date  . Anxiety   . GERD (gastroesophageal reflux disease)     Patient Active Problem List   Diagnosis Date Noted  . 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 Pre-menstrual mood disorder 07/05/2014  . Vitamin D deficiency 03/21/2014  . Other malaise and fatigue 03/02/2014  . Obesity, unspecified 03/02/2014  . Elevated blood pressure reading without diagnosis of hypertension 03/02/2014    Past Surgical History:  Procedure Laterality Date  . APPENDECTOMY    . GASTRIC BYPASS    . 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 HIATAL HERNIA REPAIR N/A 01/01/2015   Procedure: LAPAROSCOPIC REPAIR OF HIATAL HERNIA;  Surgeon: Pedro Earls, MD;  Location: [facility];  Service: General;  Laterality: N/A;    OB History    No data available       Home Medications    Prior to Admission medications   Medication Sig Start Date End Date Taking? Authorizing Provider  citalopram (CELEXA) 20 MG tablet TAKE 1 TABLET BY MOUTH DAILY. 09/22/16  Yes Forrest Moron, MD  Vitamin D, Ergocalciferol, (DRISDOL) 50000 units CAPS capsule Take 1 capsule (50,000 Units total) by mouth every 7 (seven) days. 09/23/16  Yes Forrest Moron, MD  tobramycin-dexamethasone (TOBRADEX) ophthalmic ointment Place 1 application into the right  eye 3 (three) times daily. 10/10/16   Robyn Haber, MD    Family History Family History  Problem Relation Age of Onset  . Arthritis Mother   . Alcohol abuse Father   . Diabetes Father   . Heart disease Father   . Hyperlipidemia Father   . Hypertension Father     Social History Social History  Substance Use Topics  . Smoking status: Never Smoker  . Smokeless tobacco: Never Used  . Alcohol use Yes     Allergies   Review of patient's allergies indicates no known allergies.   Review of Systems Review of Systems  Constitutional: Negative.   HENT: Negative.   Eyes: Positive for photophobia, pain, redness and itching.  Respiratory: Negative.   Cardiovascular: Negative.   Musculoskeletal: Negative.   Skin: Negative.      Physical Exam Triage Vital Signs ED Triage Vitals [10/10/16 1831]  Enc Vitals Group     BP 155/86     Pulse Rate 72     Resp 20     Temp 98.5 F (36.9 C)     Temp Source Oral     SpO2 99 %     Weight      Height      Head Circumference      Peak Flow      Pain Score      Pain Loc      Pain Edu?  Excl. in [place]?    No data found.   Updated Vital Signs BP 155/86 (BP Location: Left Arm)   Pulse 72   Temp 98.5 F (36.9 C) (Oral)   Resp 20   LMP 09/26/2016   SpO2 99%     Physical Exam  Constitutional: She is oriented to person, place, and time. She appears well-developed and well-nourished.  HENT:  Head: Normocephalic and atraumatic.  Right Ear: External ear normal.  Left Ear: External ear normal.  Nose: Nose normal.  Eyes: EOM are normal. Pupils are equal, round, and reactive to light. Right eye exhibits no discharge. Left eye exhibits no discharge. No scleral icterus.  Right conjunctiva is injected ringing the iris with a pink blush.  Neck: Normal range of motion. Neck supple.  Pulmonary/Chest: Effort normal.  Musculoskeletal: Normal range of motion.  Neurological: She is alert and oriented to person, place, and time.    Skin: Skin is warm and dry.  Nursing note and vitals reviewed.    UC Treatments / Results  Labs (all labs ordered are listed, but only abnormal results are displayed) Labs Reviewed - No data to display  EKG  EKG Interpretation None       Radiology No results found.  Procedures Procedures (including critical care time)  Medications Ordered in UC Medications - No data to display   Initial Impression / Assessment and Plan / UC Course  I have reviewed the triage vital signs and the nursing notes.  Pertinent labs & imaging results that were available during my care of the patient were reviewed by me and considered in my medical decision making (see chart for details).  Clinical Course    Final Clinical Impressions(s) / UC Diagnoses   Final diagnoses:  Iritis, lens-induced, right    New Prescriptions New Prescriptions   TOBRAMYCIN-DEXAMETHASONE (TOBRADEX) OPHTHALMIC OINTMENT    Place 1 application into the right eye 3 (three) times daily.     Robyn Haber, MD 10/10/16 [phone]

## 2017-03-02 ENCOUNTER — Emergency Department: Payer: BLUE CROSS/BLUE SHIELD

## 2017-03-02 ENCOUNTER — Encounter: Payer: Self-pay | Admitting: Emergency Medicine

## 2017-03-02 ENCOUNTER — Emergency Department
Admission: EM | Admit: 2017-03-02 | Discharge: 2017-03-02 | Disposition: A | Discharge status: Home / Self Care | Payer: BLUE CROSS/BLUE SHIELD | Attending: Emergency Medicine | Admitting: Emergency Medicine

## 2017-03-02 DIAGNOSIS — Y999 Unspecified external cause status: Secondary | ICD-10-CM | POA: Diagnosis not present

## 2017-03-02 DIAGNOSIS — Y9389 Activity, other specified: Secondary | ICD-10-CM | POA: Diagnosis not present

## 2017-03-02 DIAGNOSIS — Y92003 Bedroom of unspecified non-institutional (private) residence as the place of occurrence of the external cause: Secondary | ICD-10-CM | POA: Diagnosis not present

## 2017-03-02 DIAGNOSIS — S060X1A Concussion with loss of consciousness of 30 minutes or less, initial encounter: Secondary | ICD-10-CM

## 2017-03-02 DIAGNOSIS — W01198A Fall on same level from slipping, tripping and stumbling with subsequent striking against other object, initial encounter: Secondary | ICD-10-CM | POA: Insufficient documentation

## 2017-03-02 DIAGNOSIS — S0990XA Unspecified injury of head, initial encounter: Secondary | ICD-10-CM | POA: Diagnosis present

## 2017-03-02 MED ORDER — ACETAMINOPHEN 500 MG PO TABS
ORAL_TABLET | ORAL | Status: AC
  Administered 2017-03-02: 1000 mg via ORAL
  Filled 2017-03-02: qty 2

## 2017-03-02 MED ORDER — ACETAMINOPHEN 500 MG PO TABS
1000.0000 mg | ORAL_TABLET | Freq: Once | ORAL | Status: AC
  Administered 2017-03-02: 1000 mg via ORAL

## 2017-03-02 NOTE — ED Triage Notes (Signed)
States mechanical fall, tripped over dog or rug, and hit head on side of bed or floor. States had positive LOC, awoke on floor. Today at work noted headache and blurry vision.

## 2017-03-02 NOTE — ED Provider Notes (Signed)
[facility] Emergency Department Provider Note  ____________________________________________   First MD Initiated Contact with Patient 03/02/17 1834     (approximate)  I have reviewed the triage vital signs and the nursing notes.   HISTORY  Chief Complaint Head Injury   HPI Alexandra Petty is a 36 y.o. female with a history of anxiety as well as GERD who is presenting to the emergency department with memory loss, posterior headache as well as blurred vision since falling last line. She says that she tripped in her bedroom playing with her dog and hit the back of her head. She has had a headache since the time of the injury but said that earlier today she forgot where she had parked her car and then had difficulty reading spreadsheets at work today. She denies being on any blood thinners. Says the headache is mild at this time. Denies drinking or drug use in the past 2 days. She says that she does drink socially, every so often.   Past Medical History:  Diagnosis Date  . Anxiety   . GERD (gastroesophageal reflux disease)     Patient Active Problem List   Diagnosis Date Noted  . 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 Pre-menstrual mood disorder 07/05/2014  . Vitamin D deficiency 03/21/2014  . Other malaise and fatigue 03/02/2014  . Obesity, unspecified 03/02/2014  . Elevated blood pressure reading without diagnosis of hypertension 03/02/2014    Past Surgical History:  Procedure Laterality Date  . APPENDECTOMY    . GASTRIC BYPASS    . 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 HIATAL HERNIA REPAIR N/A 01/01/2015   Procedure: LAPAROSCOPIC REPAIR OF HIATAL HERNIA;  Surgeon: Pedro Earls, MD;  Location: [facility];  Service: General;  Laterality: N/A;    Prior to  Admission medications   Medication Sig Start Date End Date Taking? Authorizing Provider  citalopram (CELEXA) 20 MG tablet TAKE 1 TABLET BY MOUTH DAILY. 09/22/16   Forrest Moron, MD  tobramycin-dexamethasone (TOBRADEX) ophthalmic ointment Place 1 application into the right eye 3 (three) times daily. 10/10/16   Robyn Haber, MD  Vitamin D, Ergocalciferol, (DRISDOL) 50000 units CAPS capsule Take 1 capsule (50,000 Units total) by mouth every 7 (seven) days. 09/23/16   Forrest Moron, MD    Allergies Patient has no known allergies.  Family History  Problem Relation Age of Onset  . Arthritis Mother   . Alcohol abuse Father   . Diabetes Father   . Heart disease Father   . Hyperlipidemia Father   . Hypertension Father     Social History Social History  Substance Use Topics  . Smoking status: Never Smoker  . Smokeless tobacco: Never Used  . Alcohol use Yes    Review of Systems Constitutional: No fever/chills Eyes: as above ENT: No sore throat. Cardiovascular: Denies chest pain. Respiratory: Denies shortness of breath. Gastrointestinal: No abdominal pain.  No nausea, no vomiting.  No diarrhea.  No constipation. Genitourinary: Negative for dysuria. Musculoskeletal: Negative for back pain. Skin: Negative for rash. Neurological: Negative for focal weakness or numbness.  10-point ROS otherwise negative.  ____________________________________________   PHYSICAL EXAM:  VITAL SIGNS: ED Triage Vitals  Enc Vitals Group     BP 03/02/17 1730 (!) 167/101     Pulse Rate  03/02/17 1730 69     Resp 03/02/17 1730 20     Temp 03/02/17 1730 98.8 F (37.1 C)     Temp Source 03/02/17 1730 Oral     SpO2 03/02/17 1730 99 %     Weight 03/02/17 1732 196 lb (88.9 kg)     Height 03/02/17 1732 5\' 9"  (1.753 m)     Head Circumference --      Peak Flow --      Pain Score 03/02/17 1730 7     Pain Loc --      Pain Edu? --      Excl. in [place]? --     Constitutional: Alert and oriented. Well  appearing and in no acute distress. Eyes: Conjunctivae are normal. PERRL. EOMI. Head: Atraumatic. Nose: No congestion/rhinnorhea. Mouth/Throat: Mucous membranes are moist.  Neck: No stridor.   Cardiovascular: Normal rate, regular rhythm. Grossly normal heart sounds. Respiratory: Normal respiratory effort.  No retractions. Lungs CTAB. Gastrointestinal: Soft and nontender. No distention.  Musculoskeletal: No lower extremity tenderness nor edema.  No joint effusions. Neurologic:  Normal speech and language. No gross focal neurologic deficits are appreciated. No gait instability. Skin:  Skin is warm, dry and intact. No rash noted. Psychiatric: Mood and affect are normal. Speech and behavior are normal.  ____________________________________________   LABS (all labs ordered are listed, but only abnormal results are displayed)  Labs Reviewed - No data to display ____________________________________________  EKG   ____________________________________________  RADIOLOGY  CT Head Wo Contrast (Final result)  Result time 03/02/17 18:23:06  Final result by Misty Stanley, MD (03/02/17 18:23:06)           Narrative:   CLINICAL DATA: Patient tripped and fell striking head. Loss of consciousness.  EXAM: CT HEAD WITHOUT CONTRAST  TECHNIQUE: Contiguous axial images were obtained from the base of the skull through the vertex without intravenous contrast.  COMPARISON: None.  FINDINGS: Brain: There is no evidence for acute hemorrhage, hydrocephalus, mass lesion, or abnormal extra-axial fluid collection. No definite CT evidence for acute infarction.  Vascular: No hyperdense vessel or unexpected calcification.  Skull: No evidence for fracture. No worrisome lytic or sclerotic lesion.  Sinuses/Orbits: The visualized paranasal sinuses and mastoid air cells are clear. Visualized portions of the globes and intraorbital fat are unremarkable.  Other: None.  IMPRESSION: No acute  findings.   Electronically Signed By: Misty Stanley M.D. On: 03/02/2017 18:23            ____________________________________________   PROCEDURES  Procedure(s) performed:   Procedures  Critical Care performed:   ____________________________________________   INITIAL IMPRESSION / ASSESSMENT AND PLAN / ED COURSE  Pertinent labs & imaging results that were available during my care of the patient were reviewed by me and considered in my medical decision making (see chart for details).     Blood pressure elevated. We'll recheck. However, will need blood pressure recheck is in the outpatient setting to confirm high blood pressure. Given the context the patient may have high blood pressure because of her pain and concussive syndrome.  Furthermore, we discussed the diagnosis as well as need for brain rest. The patient will be given a work note for tomorrow.  ____________________________________________   FINAL CLINICAL IMPRESSION(S) / ED DIAGNOSES  Concussion.    NEW MEDICATIONS STARTED DURING THIS VISIT:  New Prescriptions   No medications on file     Note:  This document was prepared using Dragon voice recognition software and may include unintentional dictation errors.  Orbie Pyo, MD 03/02/17 [phone]

## 2017-08-03 ENCOUNTER — Encounter (HOSPITAL_COMMUNITY): Payer: Self-pay

## 2017-08-25 ENCOUNTER — Encounter: Payer: Self-pay | Admitting: Family Medicine

## 2017-08-27 ENCOUNTER — Ambulatory Visit (INDEPENDENT_AMBULATORY_CARE_PROVIDER_SITE_OTHER): Payer: BLUE CROSS/BLUE SHIELD | Admitting: Family Medicine

## 2017-08-27 ENCOUNTER — Encounter: Payer: Self-pay | Admitting: Family Medicine

## 2017-08-27 VITALS — BP 144/93 | HR 79 | Temp 98.7°F | Resp 16 | Ht 69.0 in | Wt 230.6 lb

## 2017-08-27 DIAGNOSIS — F3281 Premenstrual dysphoric disorder: Secondary | ICD-10-CM

## 2017-08-27 MED ORDER — CITALOPRAM HYDROBROMIDE 40 MG PO TABS: ORAL_TABLET | ORAL | 3 refills | Status: DC

## 2017-08-27 MED ORDER — NORGESTIM-ETH ESTRAD TRIPHASIC 0.18/0.215/0.25 MG-35 MCG PO TABS: 1.0000 | ORAL_TABLET | Freq: Every day | ORAL | 11 refills | Status: DC

## 2017-08-27 NOTE — Patient Instructions (Addendum)
[facility] Address: [place] [address], [place], [place] [address]  Phone: [phone]  A nonprofit organization supporting thousands in [place] with intellectual and developmental disabilities, mental illness, and substance use disorders.   [facility]: Mayme Genta MD  5.0 (2)  Doctor  [place], [place]  [phone]         IF you received an x-ray today, you will receive an invoice from [facility]. Please contact [facility] at [phone] with questions or concerns regarding your invoice.   IF you received labwork today, you will receive an invoice from [facility]. Please contact [facility] at [phone] with questions or concerns regarding your invoice.   Our billing staff will not be able to assist you with questions regarding bills from these companies.  You will be contacted with the lab results as soon as they are available. The fastest way to get your results is to activate your My Chart account. Instructions are located on the last page of this paperwork. If you have not heard from [place] regarding the results in 2 weeks, please contact this office.     Premenstrual Syndrome Premenstrual syndrome (PMS) is a group of physical, emotional, and behavioral symptoms that affect women of childbearing age. PMS starts 1-2 weeks before the start of a woman's period and goes away a few days after the period starts. It often recurs in a predictable pattern. PMS can range from mild to severe. When it is severe, it is called premenstrual dysphoric disorder (PMDD). PMS can interfere in many ways with normal daily activities. What are the causes? The cause of this condition is not known, but it seems to be related to hormone changes that happen before menstruation. What are the signs or symptoms? Symptoms of this condition often happen every month. They go away completely after your period starts. Physical symptoms  include:  Bloating.  Breast pain.  Headaches.  Extreme fatigue.  Backaches.  Swelling of the hands and feet.  Weight gain.  Hot flashes.  Emotional and behavioral symptoms include:  Mood swings.  Depression.  Angry outbursts.  Irritability.  Anxiety.  Crying spells.  Food cravings or appetite changes.  Changes in sexual desire.  Confusion.  Aggression.  Social withdrawal.  Poor concentration.  How is this diagnosed? This condition is diagnosed if symptoms of PMS:  Are present in the 5 days before your period starts.  End within 4 days after your period starts.  Happen at least 3 months in a row.  Interfere with some of your normal activities.  Other conditions that can cause some of these symptoms must be ruled out before PMS can be diagnosed. How is this treated? This condition may be treated by:  Maintaining a healthy lifestyle. This includes eating a balanced diet and exercising regularly.  Taking medicines. Medicines can help relieve symptoms such as cramps, aches, pains, headaches, and breast tenderness. Depending on the severity of the condition, your health care provider may recommend: ? Over-the-counter pain medicines. ? Prescription medicines for PMDD.  Follow these instructions at home: Eating and drinking   Eat a well-balanced diet.  Avoid caffeine and alcohol.  Limit the amount of salt and salty foods you eat. This will help lessen bloating.  Drink enough fluid to keep your urine clear or pale yellow.  Take a multivitamin if told to by your health care provider. Lifestyle  Do not use any tobacco products, such as cigarettes, chewing tobacco, and e-cigarettes. If you need help  quitting, ask your health care provider.  Exercise regularly as suggested by your health care provider.  Get enough sleep.  Practice relaxation techniques.  Limit stress. Other Instructions  For 2-3 months, write down your symptoms, their  severity, and how long they last. This will help your health care provider choose the best treatment for you.  Take over-the-counter and prescription medicines only as told by your health care provider.  If you are using oral contraceptive pills, use them as told by your health care provider. This information is not intended to replace advice given to you by your health care provider. Make sure you discuss any questions you have with your health care provider. Document Released: 11/21/2000 Document Revised: 12/26/2015 Document Reviewed: 08/24/2015 Elsevier Interactive Patient Education  [employer].

## 2017-08-27 NOTE — Progress Notes (Signed)
Chief Complaint  Patient presents with  . Medication Refill    citalopram- thinks she may need something stronger, pt says she is battling some extreme depression, dx with PMDD    HPI   She has a history of PMDD She tried PAXIL and reports that she gained weight so she changed to Celexa  She reports that the Celexa is just not enough Patient's last menstrual period was 08/13/2017.  She denies a history of blood clots or cancers She is non smoking She does not have uncontrolled migraines She states that she has some depressed mood Depression screen [facility] 2/9 08/27/2017 09/22/2016 08/01/2015 05/01/2015 09/13/2014  Decreased Interest 3 0 0 0 0  Down, Depressed, Hopeless 3 0 0 0 0  PHQ - 2 Score 6 0 0 0 0  Altered sleeping 1 - - - -  Tired, decreased energy 3 - - - -  Change in appetite 0 - - - -  Feeling bad or failure about yourself  3 - - - -  Trouble concentrating 3 - - - -  Moving slowly or fidgety/restless 3 - - - -  Suicidal thoughts 0 - - - -  PHQ-9 Score 19 - - - -  Difficult doing work/chores Very difficult - - - -     Past Medical History:  Diagnosis Date  . Anxiety   . GERD (gastroesophageal reflux disease)     Current Outpatient Prescriptions  Medication Sig Dispense Refill  . citalopram (CELEXA) 40 MG tablet TAKE 1 TABLET BY MOUTH DAILY. 30 tablet 3  . Vitamin D, Ergocalciferol, (DRISDOL) 50000 units CAPS capsule Take 1 capsule (50,000 Units total) by mouth every 7 (seven) days. 12 capsule 3  . Norgestimate-Ethinyl Estradiol Triphasic (ORTHO TRI-CYCLEN, 28,) 0.18/0.215/0.25 MG-35 MCG tablet Take 1 tablet by mouth daily. 1 Package 11  . tobramycin-dexamethasone (TOBRADEX) ophthalmic ointment Place 1 application into the right eye 3 (three) times daily. (Patient not taking: Reported on 08/27/2017) 3.5 g 0   No current facility-administered medications for this visit.     Allergies: No Known Allergies  Past Surgical History:  Procedure Laterality Date  .  APPENDECTOMY    . GASTRIC BYPASS    . 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 HIATAL HERNIA REPAIR N/A 01/01/2015   Procedure: LAPAROSCOPIC REPAIR OF HIATAL HERNIA;  Surgeon: Pedro Earls, MD;  Location: [facility];  Service: General;  Laterality: N/A;    Social History   Social History  . Marital status: Married    Spouse name: N/A  . Number of children: N/A  . 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 Smoking status: Never Smoker  . Smokeless tobacco: Never Used  . Alcohol use Yes  . Drug use: No  . Sexual activity: Not Asked   Other Topics Concern  . None   Social History Narrative  . None    ROS  See HPI Constitution: No fevers or chills No malaise No diaphoresis Skin: No rash or itching Eyes: no blurry vision, no double vision GU: no dysuria or hematuria Neuro: no dizziness or headaches  Objective: Vitals:   08/27/17 1610  BP: (!) 144/93  Pulse: 79  Resp: 16  Temp: 98.7 F (37.1 C)  TempSrc: Oral  SpO2: 98%  Weight:  230 lb 9.6 oz (104.6 kg)  Height: 5\' 9"  (1.753 m)    Physical Exam  Constitutional: She is oriented to person, place, and time. She appears well-developed and well-nourished.  HENT:  Head: Normocephalic and atraumatic.  Cardiovascular: Normal rate, regular rhythm and normal heart sounds.   Pulmonary/Chest: Effort normal and breath sounds normal. No respiratory distress. She has no wheezes.  Neurological: She is alert and oriented to person, place, and time.  Psychiatric: She has a normal mood and affect. Her behavior is normal. Judgment and thought content normal.     Assessment and Plan Billiejo was seen today for medication refill.  Diagnoses and all orders for this visit:  PMDD (premenstrual dysphoric  disorder)  Started celexa paxil has weight gain profile Restarted OCPs Discussed pretreatment with NSAIDs before periods -     citalopram (CELEXA) 40 MG tablet; TAKE 1 TABLET BY MOUTH DAILY. -     Norgestimate-Ethinyl Estradiol Triphasic (ORTHO TRI-CYCLEN, 28,) 0.18/0.215/0.25 MG-35 MCG tablet; Take 1 tablet by mouth daily.     [place]

## 2017-09-09 NOTE — Progress Notes (Deleted)
  No chief complaint on file.   HPI  Past Medical History:  Diagnosis Date  . Anxiety   . GERD (gastroesophageal reflux disease)     Current Outpatient Prescriptions  Medication Sig Dispense Refill  . citalopram (CELEXA) 40 MG tablet TAKE 1 TABLET BY MOUTH DAILY. 30 tablet 3  . Norgestimate-Ethinyl Estradiol Triphasic (ORTHO TRI-CYCLEN, 28,) 0.18/0.215/0.25 MG-35 MCG tablet Take 1 tablet by mouth daily. 1 Package 11  . tobramycin-dexamethasone (TOBRADEX) ophthalmic ointment Place 1 application into the right eye 3 (three) times daily. (Patient not taking: Reported on 08/27/2017) 3.5 g 0  . Vitamin D, Ergocalciferol, (DRISDOL) 50000 units CAPS capsule Take 1 capsule (50,000 Units total) by mouth every 7 (seven) days. 12 capsule 3   No current facility-administered medications for this visit.     Allergies: No Known Allergies  Past Surgical History:  Procedure Laterality Date  . APPENDECTOMY    . GASTRIC BYPASS    . 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 HIATAL HERNIA REPAIR N/A 01/01/2015   Procedure: LAPAROSCOPIC REPAIR OF HIATAL HERNIA;  Surgeon: Pedro Earls, MD;  [facility]: [facility];  Service: General;  Laterality: N/A;    Social History   Social History  . Marital status: Married    Spouse name: N/A  . Number of children: N/A  . 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 Smoking status: Never Smoker  . Smokeless tobacco: Never Used  . Alcohol use Yes  . Drug use: No  . Sexual activity: Not on file   Other Topics Concern  . Not on file   Social History Narrative  . No narrative on file    ROS  Objective: There were no vitals filed for this visit.  Physical Exam  Assessment and Plan There are no diagnoses linked to this  encounter.   Alexandra Petty P [employer]

## 2017-09-10 ENCOUNTER — Ambulatory Visit: Payer: BLUE CROSS/BLUE SHIELD | Admitting: Family Medicine

## 2018-12-15 DIAGNOSIS — M545 Low back pain: Secondary | ICD-10-CM | POA: Diagnosis not present

## 2018-12-15 DIAGNOSIS — R293 Abnormal posture: Secondary | ICD-10-CM | POA: Diagnosis not present

## 2018-12-15 DIAGNOSIS — M9903 Segmental and somatic dysfunction of lumbar region: Secondary | ICD-10-CM | POA: Diagnosis not present

## 2018-12-15 DIAGNOSIS — M2560 Stiffness of unspecified joint, not elsewhere classified: Secondary | ICD-10-CM | POA: Diagnosis not present

## 2018-12-20 DIAGNOSIS — M2560 Stiffness of unspecified joint, not elsewhere classified: Secondary | ICD-10-CM | POA: Diagnosis not present

## 2018-12-20 DIAGNOSIS — R293 Abnormal posture: Secondary | ICD-10-CM | POA: Diagnosis not present

## 2018-12-20 DIAGNOSIS — M9903 Segmental and somatic dysfunction of lumbar region: Secondary | ICD-10-CM | POA: Diagnosis not present

## 2018-12-20 DIAGNOSIS — M545 Low back pain: Secondary | ICD-10-CM | POA: Diagnosis not present

## 2019-03-05 MED FILL — TRINTELLIX 20 MG TABLET: 20 | 30 days supply | Qty: 30 | Fill #0

## 2019-03-16 MED FILL — AMOXICILLIN 500 MG CAPSULE: 500 | 8 days supply | Qty: 28 | Fill #0

## 2019-05-30 DIAGNOSIS — F3342 Major depressive disorder, recurrent, in full remission: Secondary | ICD-10-CM | POA: Diagnosis not present

## 2019-05-30 DIAGNOSIS — F332 Major depressive disorder, recurrent severe without psychotic features: Secondary | ICD-10-CM | POA: Diagnosis not present

## 2019-06-01 MED FILL — TRINTELLIX 20 MG TABLET: 20 | 30 days supply | Qty: 30 | Fill #1

## 2019-06-25 MED FILL — TRINTELLIX 20 MG TABLET: 20 | 30 days supply | Qty: 30 | Fill #2

## 2019-07-05 MED FILL — TRINTELLIX 20 MG TABLET: 20 | 30 days supply | Qty: 30 | Fill #2

## 2019-07-29 MED FILL — TRINTELLIX 20 MG TABLET: 20 | 30 days supply | Qty: 30 | Fill #3

## 2019-08-29 MED FILL — TRINTELLIX 20 MG TABLET: 20 | 30 days supply | Qty: 30 | Fill #4

## 2019-09-09 MED FILL — TRINTELLIX 20 MG TABLET: 20 | 30 days supply | Qty: 30 | Fill #4

## 2019-10-09 DIAGNOSIS — H52223 Regular astigmatism, bilateral: Secondary | ICD-10-CM | POA: Diagnosis not present

## 2019-10-12 DIAGNOSIS — Z1231 Encounter for screening mammogram for malignant neoplasm of breast: Secondary | ICD-10-CM | POA: Diagnosis not present

## 2019-10-12 DIAGNOSIS — Z803 Family history of malignant neoplasm of breast: Secondary | ICD-10-CM | POA: Diagnosis not present

## 2019-10-12 LAB — HM MAMMOGRAPHY

## 2019-11-03 ENCOUNTER — Telehealth: Payer: BLUE CROSS/BLUE SHIELD | Admitting: Physician Assistant

## 2019-11-03 DIAGNOSIS — L03119 Cellulitis of unspecified part of limb: Secondary | ICD-10-CM

## 2019-11-03 MED ORDER — CEPHALEXIN 500 MG PO CAPS: 500.0000 mg | ORAL_CAPSULE | Freq: Four times a day (QID) | ORAL | 0 refills | Status: DC

## 2019-11-03 NOTE — Progress Notes (Signed)
E Visit for Cellulitis  We are sorry that you are not feeling well. Here is how we plan to help!  Based on what you shared with me it looks like you have cellulitis.  Cellulitis looks like areas of skin redness, swelling, and warmth; it develops as a result of bacteria entering under the skin. Little red spots and/or bleeding can be seen in skin, and tiny surface sacs containing fluid can occur. Fever can be present. Cellulitis is almost always on one side of a body, and the lower limbs are the most common site of involvement.   I have prescribed:  Keflex 500mg  take one by mouth four times a day for 5 days  HOME CARE:  . Take your medications as ordered and take all of them, even if the skin irritation appears to be healing.   GET HELP RIGHT AWAY IF:  . Symptoms that don't begin to go away within 48 hours. . Severe redness persists or worsens . If the area turns color, spreads or swells. . If it blisters and opens, develops yellow-brown crust or bleeds. . You develop a fever or chills. . If the pain increases or becomes unbearable.  . Are unable to keep fluids and food down.  MAKE SURE YOU    Understand these instructions.  Will watch your condition.  Will get help right away if you are not doing well or get worse.  Thank you for choosing an e-visit. Your e-visit answers were reviewed by a board certified advanced clinical practitioner to complete your personal care plan. Depending upon the condition, your plan could have included both over the counter or prescription medications. Please review your pharmacy choice. Make sure the pharmacy is open so you can pick up prescription now. If there is a problem, you may contact your provider through [employer] and have the prescription routed to another pharmacy. Your safety is important to [place]. If you have drug allergies check your prescription carefully.  For the next 24 hours you can use MyChart to ask questions about today's  visit, request a non-urgent call back, or ask for a work or school excuse. You will get an email in the next two days asking about your experience. I hope that your e-visit has been valuable and will speed your recovery.   Particia Nearing PA-C   Approximately 5 minutes was spent documenting and reviewing patient's chart.

## 2019-11-16 ENCOUNTER — Other Ambulatory Visit: Payer: Self-pay | Admitting: Physician Assistant

## 2019-11-16 MED ORDER — CEPHALEXIN 500 MG PO CAPS: 500.0000 mg | ORAL_CAPSULE | Freq: Four times a day (QID) | ORAL | 0 refills | Status: DC

## 2019-11-16 MED FILL — CEPHALEXIN 500 MG CAPSULE: 500 | 5 days supply | Qty: 20 | Fill #0

## 2019-12-03 ENCOUNTER — Encounter: Payer: Self-pay | Admitting: Emergency Medicine

## 2019-12-03 ENCOUNTER — Ambulatory Visit
Admission: EM | Admit: 2019-12-03 | Discharge: 2019-12-03 | Disposition: A | Discharge status: Home / Self Care | Payer: 59 | Attending: Emergency Medicine | Admitting: Emergency Medicine

## 2019-12-03 ENCOUNTER — Other Ambulatory Visit: Payer: Self-pay

## 2019-12-03 DIAGNOSIS — L03116 Cellulitis of left lower limb: Secondary | ICD-10-CM | POA: Diagnosis not present

## 2019-12-03 MED ORDER — DOXYCYCLINE HYCLATE 100 MG PO CAPS: 100.0000 mg | ORAL_CAPSULE | Freq: Two times a day (BID) | ORAL | 0 refills | Status: DC

## 2019-12-03 MED ORDER — CEFTRIAXONE SODIUM 1 G IJ SOLR
1.0000 g | Freq: Once | INTRAMUSCULAR | Status: AC
  Administered 2019-12-03: 10:00:00 1 g via INTRAMUSCULAR

## 2019-12-03 NOTE — ED Triage Notes (Signed)
Pt presents to [facility] for assessment after a fall on 11/22, which she was seen for an area of redness/cellulitis on televisit.  Given antibiotics, which she is almost done with, and states area has not improved, and seems now to have opened up and be worsening.

## 2019-12-03 NOTE — ED Provider Notes (Signed)
[name]    [name]: [id number] Arrival date & time: 12/03/19  0848      History   Chief Complaint Chief Complaint  Patient presents with  . APPT: 9am  . Wound Check    HPI Alexandra Petty is a 38 y.o. female presented for evaluation of left anterior lower leg cellulitis.  Patient was seen initially for this via virtual visit on 11/03/2019: Given Keflex 4 times daily.  There is an issue with pharmacy receiving medication, so patient was unable to pick up medication until 12/9.  States she has been taking this once daily since: Still has 5 tabs left.  Overall, patient feels redness is improved from initial evaluation, though has noticed opening in skin which was not previously there.  Patient denies knee pain, ankle pain, fever.  Patient is hypertensive: Denies chest pain, breath, headache, change in vision, severe abdominal pain.    Past Medical History:  Diagnosis Date  . Anxiety   . GERD (gastroesophageal reflux disease)     Patient Active Problem List   Diagnosis Date Noted  . 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 Pre-menstrual mood disorder 07/05/2014  . Vitamin D deficiency 03/21/2014  . Other malaise and fatigue 03/02/2014  . Obesity, unspecified 03/02/2014  . Elevated blood pressure reading without diagnosis of hypertension 03/02/2014    Past Surgical History:  Procedure Laterality Date  . APPENDECTOMY    . GASTRIC BYPASS    . 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 HIATAL HERNIA REPAIR N/A 01/01/2015   Procedure: LAPAROSCOPIC REPAIR OF HIATAL HERNIA;  Surgeon: Pedro Earls, MD;  Location: [facility];  Service: General;  Laterality: N/A;    OB History   No obstetric history on file.      Home Medications    Prior to Admission medications   Medication  Sig Start Date End Date Taking? Authorizing Provider  cephALEXin (KEFLEX) 500 MG capsule Take 1 capsule (500 mg total) by mouth 4 (four) times daily. 11/16/19   Terald Sleeper, PA-C  citalopram (CELEXA) 40 MG tablet TAKE 1 TABLET BY MOUTH DAILY. 08/27/17   Forrest Moron, MD  doxycycline (VIBRAMYCIN) 100 MG capsule Take 1 capsule (100 mg total) by mouth 2 (two) times daily. 12/03/19   Hall-Potvin, [place], PA-C  Norgestimate-Ethinyl Estradiol Triphasic (ORTHO TRI-CYCLEN, 28,) 0.18/0.215/0.25 MG-35 MCG tablet Take 1 tablet by mouth daily. 08/27/17   Forrest Moron, MD  tobramycin-dexamethasone (TOBRADEX) ophthalmic ointment Place 1 application into the right eye 3 (three) times daily. Patient not taking: Reported on 08/27/2017 10/10/16   Robyn Haber, MD  Vitamin D, Ergocalciferol, (DRISDOL) 50000 units CAPS capsule Take 1 capsule (50,000 Units total) by mouth every 7 (seven) days. 09/23/16   Forrest Moron, MD    Family History Family History  Problem Relation Age of Onset  . Arthritis Mother   . Alcohol abuse Father   . Diabetes Father   . Heart disease Father   . Hyperlipidemia Father   . Hypertension Father     Social History Social History   Tobacco Use  . Smoking status: Never Smoker  . Smokeless tobacco: Never Used  Substance Use Topics  . Alcohol use: Yes  . Drug use: No     Allergies   Patient has no known allergies.  Review of Systems Review of Systems  Constitutional: Negative for fatigue and fever.  HENT: Negative for ear pain, sinus pain, sore throat and voice change.   Eyes: Negative for pain, redness and visual disturbance.  Respiratory: Negative for cough and shortness of breath.   Cardiovascular: Negative for chest pain and palpitations.  Gastrointestinal: Negative for abdominal pain, diarrhea and vomiting.  Musculoskeletal: Negative for arthralgias and myalgias.  Skin: Positive for wound. Negative for rash.  Neurological: Negative for syncope and  headaches.     Physical Exam Triage Vital Signs ED Triage Vitals  Enc Vitals Group     BP 12/03/19 0903 (!) 179/133     Pulse Rate 12/03/19 0903 93     Resp 12/03/19 0903 16     Temp 12/03/19 0903 98.1 F (36.7 C)     Temp Source 12/03/19 0903 Oral     SpO2 12/03/19 0903 98 %     Weight --      Height --      Head Circumference --      Peak Flow --      Pain Score 12/03/19 0910 0     Pain Loc --      Pain Edu? --      Excl. in [place]? --    No data found.  Updated Vital Signs BP (!) 179/133 (BP Location: Left Arm)   Pulse 93   Temp 98.1 F (36.7 C) (Oral)   Resp 16   SpO2 98%   Visual Acuity Right Eye Distance:   Left Eye Distance:   Bilateral Distance:    Right Eye Near:   Left Eye Near:    Bilateral Near:     Physical Exam Constitutional:      General: She is not in acute distress. HENT:     Head: Normocephalic and atraumatic.  Eyes:     General: No scleral icterus.    Pupils: Pupils are equal, round, and reactive to light.  Cardiovascular:     Rate and Rhythm: Normal rate.  Pulmonary:     Effort: Pulmonary effort is normal.  Musculoskeletal:        General: Normal range of motion.     Right lower leg: No edema.     Comments: 1+ pitting edema of left lower leg  Skin:    Capillary Refill: Capillary refill takes less than 2 seconds.     Coloration: Skin is not jaundiced or pale.     Comments: Greater than 10 cm area of erythema with 3 cm x 4 cm superficial skin breakdown with granulation tissue.  Nontender.  Black eschar overlying, though this provider D removed: Good granular tissue inferiorly without malodor.  Lesion marked with skin marker, dressed by provider.  Neurological:     General: No focal deficit present.     Mental Status: She is alert and oriented to person, place, and time.      UC Treatments / Results  Labs (all labs ordered are listed, but only abnormal results are displayed) Labs Reviewed - No data to  display  EKG   Radiology No results found.  Procedures Procedures (including critical care time)  Medications Ordered in UC Medications  cefTRIAXone (ROCEPHIN) injection 1 g (has no administration in time range)    Initial Impression / Assessment and Plan / UC Course  I have reviewed the triage vital signs and the nursing notes.  Pertinent labs & imaging results that were available during my care of the patient  were reviewed by me and considered in my medical decision making (see chart for details).     Patient hypertensive, though afebrile, nontoxic, without tachycardia.  Overall patient appears stable.  Patient provides picture from initial time of injury: Erythema is decreased, though open wound has developed.  Patient to discontinue Keflex today, start doxycycline.  Patient given 1 g Rocephin in office which she tolerated well.  Patient to be reevaluated by me in clinic 2 days from now.  ER return precautions discussed for the interim, patient verbalized understanding and is agreeable to plan. Final Clinical Impressions(s) / UC Diagnoses   Final diagnoses:  Cellulitis of leg, left     Discharge Instructions     Very important to read your medication bottles when taking prescription medications as mentioned point Stop Keflex today-we will start doxycycline today. Doxycycline is 1 tab twice daily (breakfast, dinner). Please return to clinic in 2 days for wound check. Go to ER sooner if you develop pain with walking, bleeding, thick/purulent discharge, fever, spreading of redness.    ED Prescriptions    Medication Sig Dispense Auth. Provider   doxycycline (VIBRAMYCIN) 100 MG capsule Take 1 capsule (100 mg total) by mouth 2 (two) times daily. 20 capsule Hall-Potvin, [place], PA-C     PDMP not reviewed this encounter.   Neldon Mc [place], [place] 12/03/19 [phone]

## 2019-12-03 NOTE — ED Notes (Signed)
Patient able to ambulate independently  

## 2019-12-03 NOTE — Discharge Instructions (Signed)
Very important to read your medication bottles when taking prescription medications as mentioned point Stop Keflex today-we will start doxycycline today. Doxycycline is 1 tab twice daily (breakfast, dinner). Please return to clinic in 2 days for wound check. Go to ER sooner if you develop pain with walking, bleeding, thick/purulent discharge, fever, spreading of redness.

## 2019-12-05 ENCOUNTER — Encounter: Payer: Self-pay | Admitting: Emergency Medicine

## 2019-12-05 ENCOUNTER — Ambulatory Visit
Admission: EM | Admit: 2019-12-05 | Discharge: 2019-12-05 | Disposition: A | Discharge status: Home / Self Care | Payer: 59 | Attending: Emergency Medicine | Admitting: Emergency Medicine

## 2019-12-05 ENCOUNTER — Other Ambulatory Visit: Payer: Self-pay

## 2019-12-05 DIAGNOSIS — Z5189 Encounter for other specified aftercare: Secondary | ICD-10-CM

## 2019-12-05 MED ORDER — MUPIROCIN CALCIUM 2 % EX CREA: 1.0000 "application " | TOPICAL_CREAM | Freq: Two times a day (BID) | CUTANEOUS | 0 refills | Status: DC

## 2019-12-05 NOTE — ED Provider Notes (Addendum)
[name]    [name]: [id number] Arrival date & time: 12/05/19  1538      History   Chief Complaint Chief Complaint  Patient presents with  . SEE NEXT  . Wound Check    HPI Alexandra Petty is a 38 y.o. female presenting for wound check.  Patient initially seen by me on 12/26: Please see those records for additional HPI.  Patient states that black eschar has disappeared, she has compliant with dressing changes, antibiotics.  Patient's blood pressure is elevated today: Patient denied headache, change in vision, chest pain, shortness of breath, severe abdominal pain, lower extremity edema.    Past Medical History:  Diagnosis Date  . Anxiety   . GERD (gastroesophageal reflux disease)     Patient Active Problem List   Diagnosis Date Noted  . 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 Pre-menstrual mood disorder 07/05/2014  . Vitamin D deficiency 03/21/2014  . Other malaise and fatigue 03/02/2014  . Obesity, unspecified 03/02/2014  . Elevated blood pressure reading without diagnosis of hypertension 03/02/2014    Past Surgical History:  Procedure Laterality Date  . APPENDECTOMY    . GASTRIC BYPASS    . 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 HIATAL HERNIA REPAIR N/A 01/01/2015   Procedure: LAPAROSCOPIC REPAIR OF HIATAL HERNIA;  Surgeon: Pedro Earls, MD;  Location: [facility];  Service: General;  Laterality: N/A;    OB History   No obstetric history on file.      Home Medications    Prior to Admission medications   Medication Sig Start Date End Date Taking? Authorizing Provider  cephALEXin (KEFLEX) 500 MG capsule Take 1 capsule (500 mg total) by mouth 4 (four) times daily. 11/16/19   Terald Sleeper, PA-C  citalopram (CELEXA) 40 MG tablet TAKE 1 TABLET BY MOUTH DAILY.  08/27/17   Forrest Moron, MD  doxycycline (VIBRAMYCIN) 100 MG capsule Take 1 capsule (100 mg total) by mouth 2 (two) times daily. 12/03/19   Hall-Potvin, [place], PA-C  mupirocin cream (BACTROBAN) 2 % Apply 1 application topically 2 (two) times daily. 12/05/19   Hall-Potvin, [place], PA-C  Norgestimate-Ethinyl Estradiol Triphasic (ORTHO TRI-CYCLEN, 28,) 0.18/0.215/0.25 MG-35 MCG tablet Take 1 tablet by mouth daily. 08/27/17   Forrest Moron, MD  tobramycin-dexamethasone (TOBRADEX) ophthalmic ointment Place 1 application into the right eye 3 (three) times daily. Patient not taking: Reported on 08/27/2017 10/10/16   Robyn Haber, MD  Vitamin D, Ergocalciferol, (DRISDOL) 50000 units CAPS capsule Take 1 capsule (50,000 Units total) by mouth every 7 (seven) days. 09/23/16   Forrest Moron, MD    Family History Family History  Problem Relation Age of Onset  . Arthritis Mother   . Alcohol abuse Father   . Diabetes Father   . Heart disease Father   . Hyperlipidemia Father   . Hypertension Father     Social History Social History   Tobacco Use  . Smoking status: Never Smoker  . Smokeless tobacco: Never Used  Substance Use Topics  . Alcohol use: Yes  . Drug use: No     Allergies   Patient has no known allergies.   Review of Systems As per HPI  Physical Exam Triage Vital Signs ED Triage Vitals  Enc Vitals Group     BP  12/05/19 1619 (!) 186/126     Pulse Rate 12/05/19 1619 78     Resp 12/05/19 1619 18     Temp 12/05/19 1619 98.5 F (36.9 C)     Temp Source 12/05/19 1619 Temporal     SpO2 12/05/19 1619 97 %     Weight --      Height --      Head Circumference --      Peak Flow --      Pain Score 12/05/19 1620 0     Pain Loc --      Pain Edu? --      Excl. in [place]? --    No data found.  Updated Vital Signs BP (!) 186/126 (BP Location: Left Arm)   Pulse 78   Temp 98.5 F (36.9 C) (Temporal)   Resp 18   SpO2 97%   Visual Acuity Right Eye Distance:   Left  Eye Distance:   Bilateral Distance:    Right Eye Near:   Left Eye Near:    Bilateral Near:     Physical Exam Constitutional:      General: She is not in acute distress.    Appearance: She is not ill-appearing.  HENT:     Head: Normocephalic and atraumatic.  Eyes:     General: No scleral icterus.    Pupils: Pupils are equal, round, and reactive to light.  Cardiovascular:     Rate and Rhythm: Normal rate.     Comments: Repeat BP 169/113 Pulmonary:     Effort: Pulmonary effort is normal. No respiratory distress.     Breath sounds: No wheezing.  Musculoskeletal:        General: No tenderness. Normal range of motion.     Right lower leg: No edema.     Left lower leg: No edema.     Comments: Previously noted 1+ pitting edema now resolved  Skin:    Capillary Refill: Capillary refill takes less than 2 seconds.     Coloration: Skin is not jaundiced or pale.     Comments: 3 x 4 cm area of skin breakdown appears better than previous visit.  Sure is gone: Good granulation tissue extending over wound.  Surrounding erythema has receded within previously marked area by approximately 0.5 cm.  No warmth or tenderness to erythema which does blanch.  No streaking up or down leg.  Neurological:     Mental Status: She is alert and oriented to person, place, and time.      UC Treatments / Results  Labs (all labs ordered are listed, but only abnormal results are displayed) Labs Reviewed - No data to display  EKG   Radiology No results found.  Procedures Procedures (including critical care time)  Medications Ordered in UC Medications - No data to display  Initial Impression / Assessment and Plan / UC Course  I have reviewed the triage vital signs and the nursing notes.  Pertinent labs & imaging results that were available during my care of the patient were reviewed by me and considered in my medical decision making (see chart for details).     Patient afebrile, nontoxic.  Patient  is hypertensive, asymptomatic: Will follow up with PCP regarding blood pressure management.  Patient also to follow-up closely with PCP in office for repeat wound checks.  We will continue antibiotics, dressing changes, add mupirocin.  Discussed this provider's preference for patient to follow-up with wound clinic as well.  Patient electing to talk  with PCP first, may need referral per insurance plan.  Overall I am satisfied with how wound is healing.  Return precautions discussed, patient verbalized understanding and is agreeable to plan. Final Clinical Impressions(s) / UC Diagnoses   Final diagnoses:  Visit for wound check     Discharge Instructions     Continue antibiotic and dressing changes. Follow up w/ PCP for further surveillance (may need to see wound clinic).    ED Prescriptions    Medication Sig Dispense Auth. Provider   mupirocin cream (BACTROBAN) 2 % Apply 1 application topically 2 (two) times daily. 15 g Hall-Potvin, [place], PA-C     PDMP not reviewed this encounter.   Hall-Potvin, [place], PA-C 12/07/19 2112    Neldon Mc [place], [place] 12/09/19 1909

## 2019-12-05 NOTE — Discharge Instructions (Signed)
Continue antibiotic and dressing changes. Follow up w/ PCP for further surveillance (may need to see wound clinic).

## 2019-12-05 NOTE — ED Triage Notes (Signed)
Pt returns for wound assessment from previous visit.  States area is healing well, pain has reduced, and the "black area fell off"

## 2019-12-05 NOTE — ED Notes (Signed)
Patient able to ambulate independently  

## 2020-04-20 ENCOUNTER — Encounter: Payer: Self-pay | Admitting: Registered Nurse

## 2020-04-20 ENCOUNTER — Ambulatory Visit: Payer: 59 | Admitting: Registered Nurse

## 2020-04-20 ENCOUNTER — Other Ambulatory Visit: Payer: Self-pay

## 2020-04-20 VITALS — BP 161/121 | HR 109 | Temp 97.7°F | Resp 16 | Ht 69.0 in | Wt 282.8 lb

## 2020-04-20 DIAGNOSIS — Z1159 Encounter for screening for other viral diseases: Secondary | ICD-10-CM

## 2020-04-20 DIAGNOSIS — Z13228 Encounter for screening for other metabolic disorders: Secondary | ICD-10-CM

## 2020-04-20 DIAGNOSIS — T148XXD Other injury of unspecified body region, subsequent encounter: Secondary | ICD-10-CM | POA: Diagnosis not present

## 2020-04-20 DIAGNOSIS — S81802A Unspecified open wound, left lower leg, initial encounter: Secondary | ICD-10-CM

## 2020-04-20 DIAGNOSIS — Z130 Encounter for screening for diseases of the blood and blood-forming organs and certain disorders involving the immune mechanism: Secondary | ICD-10-CM

## 2020-04-20 DIAGNOSIS — Z13220 Encounter for screening for lipoid disorders: Secondary | ICD-10-CM | POA: Diagnosis not present

## 2020-04-20 DIAGNOSIS — S81802D Unspecified open wound, left lower leg, subsequent encounter: Secondary | ICD-10-CM

## 2020-04-20 DIAGNOSIS — Z1329 Encounter for screening for other suspected endocrine disorder: Secondary | ICD-10-CM

## 2020-04-20 MED ORDER — MUPIROCIN 2 % EX OINT: 1.0000 "application " | TOPICAL_OINTMENT | Freq: Two times a day (BID) | CUTANEOUS | 0 refills | Status: DC

## 2020-04-20 MED ORDER — SULFAMETHOXAZOLE-TRIMETHOPRIM 400-80 MG PO TABS: 1.0000 | ORAL_TABLET | Freq: Two times a day (BID) | ORAL | 0 refills | Status: DC

## 2020-04-20 NOTE — Patient Instructions (Signed)
° ° ° °  If you have lab work done today you will be contacted with your lab results within the next 2 weeks.  If you have not heard from us then please contact us. The fastest way to get your results is to register for My Chart. ° ° °IF you received an x-ray today, you will receive an invoice from [facility]. Please contact [facility] at [phone] with questions or concerns regarding your invoice.  ° °IF you received labwork today, you will receive an invoice from [facility]. Please contact [facility] at [phone] with questions or concerns regarding your invoice.  ° °Our billing staff will not be able to assist you with questions regarding bills from these companies. ° °You will be contacted with the lab results as soon as they are available. The fastest way to get your results is to activate your My Chart account. Instructions are located on the last page of this paperwork. If you have not heard from us regarding the results in 2 weeks, please contact this office. °  ° ° ° °

## 2020-04-20 NOTE — Progress Notes (Signed)
Acute Office Visit  Subjective:    Patient ID: Alexandra Petty, female    DOB: Apr 12, 1981, 39 y.o.   MRN: [id number]  Chief Complaint  Patient presents with  . Fall    Patient states she fell in November and had a wound on left leg and it was big but not an open wound. Patient to Urgent care in Dec because the wound opened up they did a wound check and the wound is still open this month and has not healed.     HPI Patient is in today for wound on leg.  Nov 2020 - bought a new home, was moving stuff down a set of stairs. Missed a step and struck anterior aspect of LLE on a step. Bruising, swelling, and tenderness at that time - no open wound Dec 2020 - wound opened, drainage, no obvious signs of infection, but went to be seen by urgent care late in the month. She was given courses of both doxycycline and keflex, which did not seem to provide much benefit  Presents today with ongoing weeping wound. States it may be somewhat smaller than previously, but concerned for extreme delay in healing. Weeping and sloughing yellow-gray material. Some local redness from bandage, which stays in place consistently. Has been trying to keep clean. No edema, redness, or skin involvement beyond local area.   Otherwise, no concerns at this time.  Past Medical History:  Diagnosis Date  . Anxiety   . GERD (gastroesophageal reflux disease)     Past Surgical History:  Procedure Laterality Date  . APPENDECTOMY    . GASTRIC BYPASS    . 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 HIATAL HERNIA REPAIR N/A 01/01/2015   Procedure: LAPAROSCOPIC REPAIR OF HIATAL HERNIA;  Surgeon: Pedro Earls, MD;  Location: [facility];  Service: General;  Laterality: N/A;    Family History  Problem Relation Age of Onset  . Arthritis Mother   . Alcohol abuse Father   .  Diabetes Father   . Heart disease Father   . Hyperlipidemia Father   . Hypertension Father     Social History   Socioeconomic History  . Marital status: Married    Spouse name: Not on file  . Number of children: Not on file  . Years of education: 12+  . Highest education level: Not on file  Occupational History  . Occupation: Aeronautical engineer: [employer]  Tobacco Use  . Smoking status: Never Smoker  . Smokeless tobacco: Never Used  Substance and Sexual Activity  . Alcohol use: Yes  . Drug use: No  . Sexual activity: Not on file  Other Topics Concern  . Not on file  Social History Narrative  . Not on file   Social Determinants of Health   Financial Resource Strain:   . Difficulty of Paying Living Expenses:   Food Insecurity:   . Worried About Charity fundraiser in the Last Year:   . Arboriculturist in the Last Year:   Transportation Needs:   . Film/video editor (Medical):   Marland Kitchen Lack of Transportation (Non-Medical):   Physical Activity:   . Days of Exercise per Week:   . Minutes of Exercise per Session:   Stress:   . Feeling of Stress :   Social Connections:   .  Frequency of Communication with Friends and Family:   . Frequency of Social Gatherings with Friends and Family:   . Attends Religious Services:   . Active Member of Clubs or Organizations:   . Attends Archivist Meetings:   Marland Kitchen Marital Status:   Intimate Partner Violence:   . Fear of Current or Ex-Partner:   . Emotionally Abused:   Marland Kitchen Physically Abused:   . Sexually Abused:     Outpatient Medications Prior to Visit  Medication Sig Dispense Refill  . cephALEXin (KEFLEX) 500 MG capsule Take 1 capsule (500 mg total) by mouth 4 (four) times daily. (Patient not taking: Reported on 04/20/2020) 20 capsule 0  . citalopram (CELEXA) 40 MG tablet TAKE 1 TABLET BY MOUTH DAILY. 30 tablet 3  . doxycycline (VIBRAMYCIN) 100 MG capsule Take 1 capsule (100 mg total) by mouth 2 (two) times daily.  (Patient not taking: Reported on 04/20/2020) 20 capsule 0  . mupirocin cream (BACTROBAN) 2 % Apply 1 application topically 2 (two) times daily. (Patient not taking: Reported on 04/20/2020) 15 g 0  . Norgestimate-Ethinyl Estradiol Triphasic (ORTHO TRI-CYCLEN, 28,) 0.18/0.215/0.25 MG-35 MCG tablet Take 1 tablet by mouth daily. 1 Package 11  . tobramycin-dexamethasone (TOBRADEX) ophthalmic ointment Place 1 application into the right eye 3 (three) times daily. (Patient not taking: Reported on 08/27/2017) 3.5 g 0  . Vitamin D, Ergocalciferol, (DRISDOL) 50000 units CAPS capsule Take 1 capsule (50,000 Units total) by mouth every 7 (seven) days. 12 capsule 3   No facility-administered medications prior to visit.    No Known Allergies  Review of Systems  Constitutional: Negative.   HENT: Negative.   Eyes: Negative.   Respiratory: Negative.   Cardiovascular: Negative.   Gastrointestinal: Negative.   Endocrine: Negative.   Genitourinary: Negative.   Musculoskeletal: Negative.   Skin: Negative.   Allergic/Immunologic: Negative.   Neurological: Negative.   Hematological: Negative.   Psychiatric/Behavioral: Negative.   All other systems reviewed and are negative.      Objective:    Physical Exam Vitals and nursing note reviewed.  Constitutional:      General: She is not in acute distress.    Appearance: Normal appearance. She is normal weight. She is not ill-appearing, toxic-appearing or diaphoretic.  Cardiovascular:     Rate and Rhythm: Normal rate and regular rhythm.     Pulses: Normal pulses.  Skin:    General: Skin is warm and dry.     Capillary Refill: Capillary refill takes less than 2 seconds.     Coloration: Skin is not jaundiced or pale.     Findings: Lesion present. No bruising, erythema or rash.       Neurological:     General: No focal deficit present.     Mental Status: She is alert and oriented to person, place, and time. Mental status is at baseline.  Psychiatric:          Mood and Affect: Mood normal.        Behavior: Behavior normal.        Thought Content: Thought content normal.        Judgment: Judgment normal.     BP (!) 161/121   Pulse (!) 109   Temp 97.7 F (36.5 C) (Temporal)   Resp 16   Ht 5\' 9"  (1.753 m)   Wt 282 lb 12.8 oz (128.3 kg)   SpO2 96%   BMI 41.76 kg/m  Wt Readings from Last 3 Encounters:  04/20/20 282 lb 12.8  oz (128.3 kg)  08/27/17 230 lb 9.6 oz (104.6 kg)  03/02/17 196 lb (88.9 kg)    Health Maintenance Due  Topic Date Due  . HIV Screening  Never done    There are no preventive care reminders to display for this patient.   Lab Results  Component Value Date   TSH 1.45 09/22/2016   Lab Results  Component Value Date   WBC 8.4 09/22/2016   HGB 13.8 09/22/2016   HCT 40.4 09/22/2016   MCV 95.1 09/22/2016   PLT 340 09/22/2016   Lab Results  Component Value Date   NA 139 09/22/2016   K 4.4 09/22/2016   CO2 24 09/22/2016   GLUCOSE 82 09/22/2016   BUN 9 09/22/2016   CREATININE 0.68 09/22/2016   BILITOT 0.5 09/22/2016   ALKPHOS 67 09/22/2016   AST 13 09/22/2016   ALT 9 09/22/2016   PROT 7.0 09/22/2016   ALBUMIN 3.9 09/22/2016   CALCIUM 9.0 09/22/2016   ANIONGAP 7 12/27/2014   Lab Results  Component Value Date   CHOL 139 09/22/2016   Lab Results  Component Value Date   HDL 81 09/22/2016   Lab Results  Component Value Date   LDLCALC 46 09/22/2016   Lab Results  Component Value Date   TRIG 59 09/22/2016   Lab Results  Component Value Date   CHOLHDL 1.7 09/22/2016   Lab Results  Component Value Date   HGBA1C 5.0 03/01/2014       Assessment & Plan:   Problem List Items Addressed This Visit    None    Visit Diagnoses    Unspecified open wound, left lower leg, initial encounter    -  Primary   Relevant Medications   sulfamethoxazole-trimethoprim (BACTRIM) 400-80 MG tablet   mupirocin ointment (BACTROBAN) 2 %   Screening for endocrine, metabolic and immunity disorder        Relevant Orders   TSH   CBC with Differential   Comprehensive metabolic panel   Hemoglobin A1c   Encounter for screening for other viral diseases       Relevant Orders   HIV antibody (with reflex)   Lipid screening       Relevant Orders   Lipid panel   Delayed wound healing       Relevant Orders   HIV antibody (with reflex)   TSH   CBC with Differential   Comprehensive metabolic panel   Hemoglobin A1c   WOUND CULTURE   AMB referral to wound care center       Meds ordered this encounter  Medications  . sulfamethoxazole-trimethoprim (BACTRIM) 400-80 MG tablet    Sig: Take 1 tablet by mouth 2 (two) times daily.    Dispense:  14 tablet    Refill:  0    Order Specific Question:   Supervising Provider    Answer:   Delia Chimes A [id number]  . mupirocin ointment (BACTROBAN) 2 %    Sig: Place 1 application into the nose 2 (two) times daily.    Dispense:  22 g    Refill:  0    Order Specific Question:   Supervising Provider    Answer:   Delia Chimes A [id number]   PLAN  Wound appears infected. Suggest airing it  Out more freuqently  One week bactrim po bid  Mupirocin applied bid  Wound care clinic referral placed  HTN - pt to return for CPE, if still elevated, we will initiate treatment  Advised to  return sooner if HTN becomes symptomatic.  Patient encouraged to call clinic with any questions, comments, or concerns.   Maximiano Coss, NP

## 2020-04-21 LAB — CBC WITH DIFFERENTIAL/PLATELET
Basophils Absolute: 0 10*3/uL (ref 0.0–0.2)
Basos: 1 %
EOS (ABSOLUTE): 0.1 10*3/uL (ref 0.0–0.4)
Eos: 2 %
Hematocrit: 38.6 % (ref 34.0–46.6)
Hemoglobin: 12.1 g/dL (ref 11.1–15.9)
Immature Grans (Abs): 0 10*3/uL (ref 0.0–0.1)
Immature Granulocytes: 0 %
Lymphocytes Absolute: 2.2 10*3/uL (ref 0.7–3.1)
Lymphs: 27 %
MCH: 26.9 pg (ref 26.6–33.0)
MCHC: 31.3 g/dL — ABNORMAL LOW (ref 31.5–35.7)
MCV: 86 fL (ref 79–97)
Monocytes Absolute: 0.7 10*3/uL (ref 0.1–0.9)
Monocytes: 9 %
Neutrophils Absolute: 5 10*3/uL (ref 1.4–7.0)
Neutrophils: 61 %
Platelets: 459 10*3/uL — ABNORMAL HIGH (ref 150–450)
RBC: 4.49 x10E6/uL (ref 3.77–5.28)
RDW: 14.4 % (ref 11.7–15.4)
WBC: 8.2 10*3/uL (ref 3.4–10.8)

## 2020-04-21 LAB — COMPREHENSIVE METABOLIC PANEL
ALT: 14 IU/L (ref 0–32)
AST: 17 IU/L (ref 0–40)
Albumin/Globulin Ratio: 1.4 (ref 1.2–2.2)
Albumin: 4.1 g/dL (ref 3.8–4.8)
Alkaline Phosphatase: 145 IU/L — ABNORMAL HIGH (ref 39–117)
BUN/Creatinine Ratio: 12 (ref 9–23)
BUN: 9 mg/dL (ref 6–24)
Bilirubin Total: 0.2 mg/dL (ref 0.0–1.2)
CO2: 23 mmol/L (ref 20–29)
Calcium: 8.8 mg/dL (ref 8.7–10.2)
Chloride: 102 mmol/L (ref 96–106)
Creatinine, Ser: 0.75 mg/dL (ref 0.57–1.00)
GFR calc Af Amer: 115 mL/min/{1.73_m2} (ref >59)
GFR calc non Af Amer: 100 mL/min/{1.73_m2} (ref >59)
Globulin, Total: 3 g/dL (ref 1.5–4.5)
Glucose: 76 mg/dL (ref 65–99)
Potassium: 4.8 mmol/L (ref 3.5–5.2)
Sodium: 138 mmol/L (ref 134–144)
Total Protein: 7.1 g/dL (ref 6.0–8.5)

## 2020-04-21 LAB — LIPID PANEL
Chol/HDL Ratio: 3.2 ratio (ref 0.0–4.4)
Cholesterol, Total: 195 mg/dL (ref 100–199)
HDL: 61 mg/dL (ref >39)
LDL Chol Calc (NIH): 97 mg/dL (ref 0–99)
Triglycerides: 218 mg/dL — ABNORMAL HIGH (ref 0–149)
VLDL Cholesterol Cal: 37 mg/dL (ref 5–40)

## 2020-04-21 LAB — HEMOGLOBIN A1C
Est. average glucose Bld gHb Est-mCnc: 103 mg/dL
Hgb A1c MFr Bld: 5.2 % (ref 4.8–5.6)

## 2020-04-21 LAB — TSH: TSH: 2.06 u[IU]/mL (ref 0.450–4.500)

## 2020-04-21 LAB — HIV ANTIBODY (ROUTINE TESTING W REFLEX): HIV Screen 4th Generation wRfx: NONREACTIVE

## 2020-04-23 ENCOUNTER — Encounter: Payer: Self-pay | Admitting: Registered Nurse

## 2020-04-23 ENCOUNTER — Ambulatory Visit: Payer: 59

## 2020-04-23 DIAGNOSIS — A498 Other bacterial infections of unspecified site: Secondary | ICD-10-CM

## 2020-04-23 LAB — WOUND CULTURE

## 2020-04-23 MED ORDER — CIPROFLOXACIN HCL 750 MG PO TABS: 750.0000 mg | ORAL_TABLET | Freq: Two times a day (BID) | ORAL | 0 refills | Status: DC

## 2020-05-01 ENCOUNTER — Ambulatory Visit (INDEPENDENT_AMBULATORY_CARE_PROVIDER_SITE_OTHER): Payer: 59 | Admitting: Registered Nurse

## 2020-05-01 ENCOUNTER — Other Ambulatory Visit: Payer: Self-pay

## 2020-05-01 VITALS — BP 171/105 | HR 76 | Temp 98.1°F | Ht 69.0 in | Wt 284.2 lb

## 2020-05-01 DIAGNOSIS — Z0001 Encounter for general adult medical examination with abnormal findings: Secondary | ICD-10-CM

## 2020-05-01 DIAGNOSIS — F418 Other specified anxiety disorders: Secondary | ICD-10-CM

## 2020-05-01 DIAGNOSIS — S81802D Unspecified open wound, left lower leg, subsequent encounter: Secondary | ICD-10-CM

## 2020-05-01 DIAGNOSIS — I10 Essential (primary) hypertension: Secondary | ICD-10-CM

## 2020-05-01 DIAGNOSIS — Z7689 Persons encountering health services in other specified circumstances: Secondary | ICD-10-CM

## 2020-05-01 DIAGNOSIS — Z0000 Encounter for general adult medical examination without abnormal findings: Secondary | ICD-10-CM

## 2020-05-01 DIAGNOSIS — S81802A Unspecified open wound, left lower leg, initial encounter: Secondary | ICD-10-CM

## 2020-05-01 MED ORDER — HYDROCHLOROTHIAZIDE 25 MG PO TABS: 25.0000 mg | ORAL_TABLET | Freq: Every day | ORAL | 0 refills | Status: DC

## 2020-05-01 MED ORDER — BUPROPION HCL ER (SR) 100 MG PO TB12: 100.0000 mg | ORAL_TABLET | Freq: Two times a day (BID) | ORAL | 0 refills | Status: DC

## 2020-05-01 NOTE — Patient Instructions (Signed)
° ° ° °  If you have lab work done today you will be contacted with your lab results within the next 2 weeks.  If you have not heard from us then please contact us. The fastest way to get your results is to register for My Chart. ° ° °IF you received an x-ray today, you will receive an invoice from [facility]. Please contact [facility] at [phone] with questions or concerns regarding your invoice.  ° °IF you received labwork today, you will receive an invoice from [facility]. Please contact [facility] at [phone] with questions or concerns regarding your invoice.  ° °Our billing staff will not be able to assist you with questions regarding bills from these companies. ° °You will be contacted with the lab results as soon as they are available. The fastest way to get your results is to activate your My Chart account. Instructions are located on the last page of this paperwork. If you have not heard from us regarding the results in 2 weeks, please contact this office. °  ° ° ° °

## 2020-05-01 NOTE — Progress Notes (Signed)
Established Patient Office Visit  Subjective:  Patient ID: Alexandra Petty, female    DOB: May 03, 1981  Age: 39 y.o. MRN: [id number]  CC:  Chief Complaint  Patient presents with  . Follow-up    Pt stated that her wound is doing is doing alot better but she has some concerns about her BP    HPI Alexandra Petty presents for TOC, CPE, wound check, htn, anxiety  TOC: formerly pt of Dr. Nolon Rod. Wishes to establish care with myself as PCP today.   Wound check: open wound on anterior of LLE. Much improved after course of bactrim and cipro, has been using mupirocin topically. No concerns for spreading infection. Tissue appears healthy without sloughing.   HTN: interested in starting on an agent. No symptoms - denies headaches, visual changes, shob, doe, chest pain, palpitations, claudication, and dependent edema - but is aware of long term consequences of untreated htn. Biological father passed in 64s from heart related illness, had untreated htn but was also etoh abuser and had other psychosocial issues.  Anxiety: Pt has been on escitalopram and citalopram in past with mixed effect. Throughout COVID, anxiety has again worsened and depression is becoming evident. Denies self harm, denies hi/si. Interested in starting on a medication for this. Has sought counseling in the past, will continue this.  Otherwise, feeling well. No further complaints at this time.   Past Medical History:  Diagnosis Date  . Anxiety   . Depression    Phreesia 05/01/2020  . GERD (gastroesophageal reflux disease)     Past Surgical History:  Procedure Laterality Date  . APPENDECTOMY    . GASTRIC BYPASS    . 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 HIATAL HERNIA REPAIR N/A 01/01/2015   Procedure: LAPAROSCOPIC REPAIR OF HIATAL HERNIA;  Surgeon: Pedro Earls,  MD;  Location: [facility];  Service: General;  Laterality: N/A;    Family History  Problem Relation Age of Onset  . Arthritis Mother   . Alcohol abuse Father   . Diabetes Father   . Heart disease Father   . Hyperlipidemia Father   . Hypertension Father     Social History   Socioeconomic History  . Marital status: Married    Spouse name: Not on file  . Number of children: Not on file  . Years of education: 12+  . Highest education level: Not on file  Occupational History  . Occupation: Aeronautical engineer: [employer]  Tobacco Use  . Smoking status: Never Smoker  . Smokeless tobacco: Never Used  Substance and Sexual Activity  . Alcohol use: Yes  . Drug use: No  . Sexual activity: Not on file  Other Topics Concern  . Not on file  Social History Narrative  . Not on file   Social Determinants of Health   Financial Resource Strain:   . Difficulty of Paying Living Expenses:   Food Insecurity:   . Worried About Charity fundraiser in the Last Year:   . Arboriculturist in the Last Year:   Transportation Needs:   . Film/video editor (Medical):   Marland Kitchen Lack of Transportation (Non-Medical):   Physical Activity:   . Days of Exercise per Week:   . Minutes of Exercise per Session:   Stress:   . Feeling of Stress :  Social Connections:   . Frequency of Communication with Friends and Family:   . Frequency of Social Gatherings with Friends and Family:   . Attends Religious Services:   . Active Member of Clubs or Organizations:   . Attends Archivist Meetings:   Marland Kitchen Marital Status:   Intimate Partner Violence:   . Fear of Current or Ex-Partner:   . Emotionally Abused:   Marland Kitchen Physically Abused:   . Sexually Abused:     Outpatient Medications Prior to Visit  Medication Sig Dispense Refill  . mupirocin ointment (BACTROBAN) 2 % Place 1 application into the nose 2 (two) times daily. 22 g 0  . ciprofloxacin (CIPRO) 750 MG tablet Take 1 tablet (750 mg total) by  mouth 2 (two) times daily. (Patient not taking: Reported on 05/01/2020) 10 tablet 0  . sulfamethoxazole-trimethoprim (BACTRIM) 400-80 MG tablet Take 1 tablet by mouth 2 (two) times daily. (Patient not taking: Reported on 05/01/2020) 14 tablet 0   No facility-administered medications prior to visit.    No Known Allergies  ROS Review of Systems  Constitutional: Negative.   HENT: Negative.   Eyes: Negative.   Respiratory: Negative.   Cardiovascular: Negative.   Gastrointestinal: Negative.   Endocrine: Negative.   Genitourinary: Negative.   Musculoskeletal: Negative.   Skin: Positive for wound. Negative for color change, pallor and rash.  Allergic/Immunologic: Negative.   Neurological: Negative.   Hematological: Negative.   Psychiatric/Behavioral: Positive for dysphoric mood. Negative for agitation, behavioral problems, confusion, decreased concentration, hallucinations, self-injury, sleep disturbance and suicidal ideas. The patient is nervous/anxious. The patient is not hyperactive.   All other systems reviewed and are negative.     Objective:    Physical Exam  Constitutional: She is oriented to person, place, and time. She appears well-developed and well-nourished. No distress.  HENT:  Head: Normocephalic and atraumatic.  Right Ear: External ear normal.  Left Ear: External ear normal.  Nose: Nose normal.  Mouth/Throat: Oropharynx is clear and moist. No oropharyngeal exudate.  Eyes: Pupils are equal, round, and reactive to light. Conjunctivae and EOM are normal. Right eye exhibits no discharge. Left eye exhibits no discharge. No scleral icterus.  Neck: No JVD present. No tracheal deviation present. No thyromegaly present.  Cardiovascular: Normal rate, regular rhythm, normal heart sounds and intact distal pulses. Exam reveals no gallop and no friction rub.  No murmur heard. Pulmonary/Chest: Effort normal and breath sounds normal. No respiratory distress. She has no wheezes. She  has no rales. She exhibits no tenderness.  Abdominal: Soft. Bowel sounds are normal. She exhibits no distension and no mass. There is no abdominal tenderness. There is no rebound and no guarding.  Musculoskeletal:        General: No tenderness, deformity or edema. Normal range of motion.     Cervical back: Normal range of motion and neck supple.  Lymphadenopathy:    She has no cervical adenopathy.  Neurological: She is alert and oriented to person, place, and time. No cranial nerve deficit. She exhibits normal muscle tone. Coordination normal.  Skin: Skin is warm and dry. Lesion (wound on anterior LLE much imrpoved. Healthy pink tissue thorughout. Healing appropriately. ) noted. No rash noted. She is not diaphoretic. No erythema. No pallor.  Psychiatric: She has a normal mood and affect. Her behavior is normal. Judgment and thought content normal.  Nursing note and vitals reviewed.   BP (!) 171/105 (BP Location: Right Arm, Patient Position: Sitting, Cuff Size: Large)   Pulse 76  Temp 98.1 F (36.7 C) (Temporal)   Ht 5\' 9"  (1.753 m)   Wt 284 lb 3.2 oz (128.9 kg)   LMP 04/09/2020   SpO2 98%   BMI 41.97 kg/m  Wt Readings from Last 3 Encounters:  05/01/20 284 lb 3.2 oz (128.9 kg)  04/20/20 282 lb 12.8 oz (128.3 kg)  08/27/17 230 lb 9.6 oz (104.6 kg)     There are no preventive care reminders to display for this patient.  There are no preventive care reminders to display for this patient.  Lab Results  Component Value Date   TSH 2.060 04/20/2020   Lab Results  Component Value Date   WBC 8.2 04/20/2020   HGB 12.1 04/20/2020   HCT 38.6 04/20/2020   MCV 86 04/20/2020   PLT 459 (H) 04/20/2020   Lab Results  Component Value Date   NA 138 04/20/2020   K 4.8 04/20/2020   CO2 23 04/20/2020   GLUCOSE 76 04/20/2020   BUN 9 04/20/2020   CREATININE 0.75 04/20/2020   BILITOT 0.2 04/20/2020   ALKPHOS 145 (H) 04/20/2020   AST 17 04/20/2020   ALT 14 04/20/2020   PROT 7.1  04/20/2020   ALBUMIN 4.1 04/20/2020   CALCIUM 8.8 04/20/2020   ANIONGAP 7 12/27/2014   Lab Results  Component Value Date   CHOL 195 04/20/2020   Lab Results  Component Value Date   HDL 61 04/20/2020   Lab Results  Component Value Date   LDLCALC 97 04/20/2020   Lab Results  Component Value Date   TRIG 218 (H) 04/20/2020   Lab Results  Component Value Date   CHOLHDL 3.2 04/20/2020   Lab Results  Component Value Date   HGBA1C 5.2 04/20/2020      Assessment & Plan:   Problem List Items Addressed This Visit    None    Visit Diagnoses    Annual physical exam    -  Primary   Essential hypertension       Relevant Medications   hydrochlorothiazide (HYDRODIURIL) 25 MG tablet   Anxiety with depression       Relevant Medications   buPROPion (WELLBUTRIN SR) 100 MG 12 hr tablet   Unspecified open wound, left lower leg, initial encounter       Encounter to establish care          Meds ordered this encounter  Medications  . hydrochlorothiazide (HYDRODIURIL) 25 MG tablet    Sig: Take 1 tablet (25 mg total) by mouth daily.    Dispense:  30 tablet    Refill:  0    Order Specific Question:   Supervising Provider    Answer:   Delia Chimes A [id number]  . buPROPion (WELLBUTRIN SR) 100 MG 12 hr tablet    Sig: Take 1 tablet (100 mg total) by mouth 2 (two) times daily.    Dispense:  90 tablet    Refill:  0    Order Specific Question:   Supervising Provider    Answer:   Forrest Moron [id number]    Follow-up: No follow-ups on file.   PLAN  Wound much improved, but still suggest wound care follow up as pseudomonas infected wound is of high concern  Reviewed lab work, which is excellent  Otherwise, unremarkable exam  Start wellbutrin 100mg  PO qd, increase to bid after one week, med check in 4-6 weeks.  Start hctz 25mg  PO qd, return in 2 weeks for nurse visit bp check, will consider combination  agent at that time if bp still elevated  Patient encouraged to call  clinic with any questions, comments, or concerns.  Maximiano Coss, NP

## 2020-05-09 ENCOUNTER — Encounter (HOSPITAL_BASED_OUTPATIENT_CLINIC_OR_DEPARTMENT_OTHER): Payer: 59 | Attending: Physician Assistant | Admitting: Physician Assistant

## 2020-05-09 DIAGNOSIS — Z809 Family history of malignant neoplasm, unspecified: Secondary | ICD-10-CM | POA: Insufficient documentation

## 2020-05-09 DIAGNOSIS — Z8249 Family history of ischemic heart disease and other diseases of the circulatory system: Secondary | ICD-10-CM | POA: Diagnosis not present

## 2020-05-09 DIAGNOSIS — I10 Essential (primary) hypertension: Secondary | ICD-10-CM | POA: Insufficient documentation

## 2020-05-09 DIAGNOSIS — L97822 Non-pressure chronic ulcer of other part of left lower leg with fat layer exposed: Secondary | ICD-10-CM | POA: Diagnosis not present

## 2020-05-09 DIAGNOSIS — I872 Venous insufficiency (chronic) (peripheral): Secondary | ICD-10-CM | POA: Insufficient documentation

## 2020-05-09 NOTE — Progress Notes (Signed)
Alexandra, Petty ([id number]) Visit Report for 05/09/2020 Chief Complaint Document Details Patient Name: Date of Service: Alexandra Petty, Alexandra Petty 05/09/2020 9:00 A M Medical Record Number: [id number] Patient Account Number: [id number] Date of Birth/Sex: Treating RN: 07-17-1981 (39 y.o. [name] Primary Care Provider: MO Billey Gosling RD Other Clinician: Referring Provider: Treating Provider/Extender: Aniceto Boss, ZO E Weeks in Treatment: 0 Information Obtained from: Patient Chief Complaint Left leg ulcer Electronic Signature(s) Signed: 05/09/2020 10:26:38 AM By: Worthy Keeler PA-C Entered By: Worthy Keeler on 05/09/2020 10:26:37 -------------------------------------------------------------------------------- Debridement Details Patient Name: Date of Service: Alexandra Petty 05/09/2020 9:00 A M Medical Record Number: [id number] Patient Account Number: [id number] Date of Birth/Sex: Treating RN: 1981-05-16 (39 y.o. [name] Primary Care Provider: [place] Billey Gosling RD Other Clinician: Referring Provider: Treating Provider/Extender: Aniceto Boss, ZO E Weeks in Treatment: 0 Debridement Performed for Assessment: Wound #1 Left,Anterior Lower Leg Performed By: Physician Worthy Keeler, PA Debridement Type: Debridement Level of Consciousness (Pre-procedure): Awake and Alert Pre-procedure Verification/Time Out Yes - 10:30 Taken: Start Time: 10:32 Pain Control: Other : benzocaine, 20% T Area Debrided (L x W): otal 1.7 (cm) x 1.9 (cm) = 3.23 (cm) Tissue and other material debrided: Viable, Non-Viable, Eschar, Subcutaneous, Fibrin/Exudate Level: Skin/Subcutaneous Tissue Debridement Description: Excisional Instrument: Curette Bleeding: Minimum Hemostasis Achieved: Pressure End Time: 10:35 Procedural Pain: 3 Post Procedural Pain: 1 Response to Treatment: Procedure was tolerated well Level of Consciousness (Post- Awake and Alert procedure): Post  Debridement Measurements of Total Wound Length: (cm) 1.7 Width: (cm) 1.9 Depth: (cm) 0.1 Volume: (cm) 0.254 Character of Wound/Ulcer Post Debridement: Improved Post Procedure Diagnosis Same as Pre-procedure Electronic Signature(s) Signed: 05/09/2020 4:50:50 PM By: Worthy Keeler PA-C Signed: 05/09/2020 5:52:33 PM By: Baruch Gouty RN, BSN Entered By: Baruch Gouty on 05/09/2020 10:35:18 -------------------------------------------------------------------------------- HPI Details Patient Name: Date of Service: Alexandra Petty 05/09/2020 9:00 A M Medical Record Number: [id number] Patient Account Number: [id number] Date of Birth/Sex: Treating RN: December 09, 1980 (39 y.o. [name] Primary Care Provider: [place] Billey Gosling RD Other Clinician: Referring Provider: Treating Provider/Extender: Aniceto Boss, ZO E Weeks in Treatment: 0 History of Present Illness HPI Description: 05/09/2020 upon evaluation today patient actually appears to be doing somewhat poorly in regard to her left lower extremity anteriorly. She states that she had an injury where she fell down her basement stairs on November 2020. Subsequently she developed initially bruising and then a hematoma underneath of the necrosis of tissue over the surface of the hematoma. In the matter of months since that time she has gone to several providers and most recently her primary care provider on May 14 who did do a culture initially she was placed on Bactrim that was subsequently switched to Cipro based on the Pseudomonas culture. Nonetheless the patient seems to be doing much better and states the pain has been much better since that time. Fortunately there is no evidence of active infection which is great news systemically or locally. There does not appear to be signs of overall worsening in my opinion based on what we see. She does have hypertension and does appear to have some lower extremity edema as well but otherwise  I do not see any evidence that she has major medical problems contributing to this other than just the fact that she had underlying hematoma. Electronic Signature(s) Signed: 05/09/2020 10:42:01 AM By: Worthy Keeler PA-C Entered By: Melburn Hake,  Elizabeht Suto on 05/09/2020 10:42:01 -------------------------------------------------------------------------------- Physical Exam Details Patient Name: Date of Service: Alexandra, [name] Petty 05/09/2020 9:00 A M Medical Record Number: [id number] Patient Account Number: [id number] Date of Birth/Sex: Treating RN: 02/22/1981 (39 y.o. [name] Primary Care Provider: [place] Billey Gosling RD Other Clinician: Referring Provider: Treating Provider/Extender: Aniceto Boss, ZO E Weeks in Treatment: 0 Constitutional patient is hypertensive.. pulse regular and within target range for patient.Marland Kitchen respirations regular, non-labored and within target range for patient.Marland Kitchen temperature within target range for patient.. Well-nourished and well-hydrated in no acute distress. Eyes conjunctiva clear no eyelid edema noted. pupils equal round and reactive to light and accommodation. Ears, Nose, Mouth, and Throat no gross abnormality of ear auricles or external auditory canals. normal hearing noted during conversation. mucus membranes moist. Respiratory normal breathing without difficulty. Cardiovascular 2+ dorsalis pedis/posterior tibialis pulses. 1+ pitting edema of the bilateral lower extremities. Musculoskeletal normal gait and posture. no significant deformity or arthritic changes, no loss or range of motion, no clubbing. Psychiatric this patient is able to make decisions and demonstrates good insight into disease process. Alert and Oriented x 3. pleasant and cooperative. Notes Upon inspection patient's wound bed actually showed signs of necrotic tissue over the surface of the wound including eschar. Subsequently she did undergo a sharp debridement which we  discussed and she approved today as well after signing consent. I was able to gently debride away the surface of the wound which included eschar along with some not high-quality granulation tissue underneath down to fairly good granulation tissue. The patient tolerated that today without complication post debridement the wound bed appears to be doing much better and I am very pleased in this regard. Electronic Signature(s) Signed: 05/09/2020 10:42:38 AM By: Worthy Keeler PA-C Entered By: Worthy Keeler on 05/09/2020 10:42:37 -------------------------------------------------------------------------------- Physician Orders Details Patient Name: Date of Service: Alexandra Petty 05/09/2020 9:00 [place] Record Number: [id number] Patient Account Number: [id number] Date of Birth/Sex: Treating RN: 30-May-1981 (39 y.o. [name] Primary Care Provider: [place] Billey Gosling RD Other Clinician: Referring Provider: Treating Provider/Extender: Aniceto Boss, ZO E Weeks in Treatment: 0 Verbal / Phone Orders: No Diagnosis Coding ICD-10 Coding Code Description [phone] Unspecified open wound, left lower leg, initial encounter L97.822 Non-pressure chronic ulcer of other part of left lower leg with fat layer exposed I10 Essential (primary) hypertension Follow-up Appointments Return Appointment in 1 week. Dressing Change Frequency Do not change entire dressing for one week. Skin Barriers/Peri-Wound Care Wound #1 Left,Anterior Lower Leg Moisturizing lotion - to leg Wound Cleansing May shower with protection. Primary Wound Dressing Wound #1 Left,Anterior Lower Leg Hydrofera Blue - classic moistened with saline Secondary Dressing Wound #1 Left,Anterior Lower Leg Dry Gauze Edema Control 3 Layer Compression System - Left Lower Extremity Avoid standing for long periods of time Elevate legs to the level of the heart or above for 30 minutes daily and/or when sitting, a frequency of:  - throughout the day Exercise regularly Patient Medications llergies: No Known Allergies A Notifications Medication Indication Start End prior to debridement 05/09/2020 benzocaine DOSE topical 20 % aerosol - aerosol topical Electronic Signature(s) Signed: 05/09/2020 4:50:50 PM By: Worthy Keeler PA-C Signed: 05/09/2020 5:52:33 PM By: Baruch Gouty RN, BSN Entered By: Baruch Gouty on 05/09/2020 10:38:41 -------------------------------------------------------------------------------- Problem List Details Patient Name: Date of Service: Alexandra Petty 05/09/2020 9:00 A M Medical Record Number: [id number] Patient Account Number: [id number] Date of Birth/Sex: Treating RN:  02/08/1981 (39 y.o. [name] Primary Care Provider: MO Billey Gosling RD Other Clinician: Referring Provider: Treating Provider/Extender: Aniceto Boss, ZO E Weeks in Treatment: 0 Active Problems ICD-10 Encounter Code Description Active Date MDM Diagnosis I87.2 Venous insufficiency (chronic) (peripheral) 05/09/2020 No Yes S81.802A Unspecified open wound, left lower leg, initial encounter 05/09/2020 No Yes L97.822 Non-pressure chronic ulcer of other part of left lower leg with fat layer exposed6/01/2020 No Yes I10 Essential (primary) hypertension 05/09/2020 No Yes Inactive Problems Resolved Problems Electronic Signature(s) Signed: 05/09/2020 10:41:54 AM By: Worthy Keeler PA-C Previous Signature: 05/09/2020 10:26:17 AM Version By: Worthy Keeler PA-C Entered By: Worthy Keeler on 05/09/2020 10:41:54 -------------------------------------------------------------------------------- Progress Note Details Patient Name: Date of Service: Alexandra Petty 05/09/2020 9:00 A M Medical Record Number: [id number] Patient Account Number: [id number] Date of Birth/Sex: Treating RN: [birth year] (39 y.o. [name] Primary Care Provider: [place] Billey Gosling RD Other Clinician: Referring Provider: Treating  Provider/Extender: Aniceto Boss, ZO E Weeks in Treatment: 0 Subjective Chief Complaint Information obtained from Patient Left leg ulcer History of Present Illness (HPI) 05/09/2020 upon evaluation today patient actually appears to be doing somewhat poorly in regard to her left lower extremity anteriorly. She states that she had an injury where she fell down her basement stairs on November 2020. Subsequently she developed initially bruising and then a hematoma underneath of the necrosis of tissue over the surface of the hematoma. In the matter of months since that time she has gone to several providers and most recently her primary care provider on May 14 who did do a culture initially she was placed on Bactrim that was subsequently switched to Cipro based on the Pseudomonas culture. Nonetheless the patient seems to be doing much better and states the pain has been much better since that time. Fortunately there is no evidence of active infection which is great news systemically or locally. There does not appear to be signs of overall worsening in my opinion based on what we see. She does have hypertension and does appear to have some lower extremity edema as well but otherwise I do not see any evidence that she has major medical problems contributing to this other than just the fact that she had underlying hematoma. Patient History Information obtained from Patient. Allergies No Known Allergies Family History Cancer - Mother, Heart Disease - Father, Hypertension - Father, No family history of Diabetes, Hereditary Spherocytosis, Kidney Disease, Lung Disease, Seizures, Stroke, Thyroid Problems, Tuberculosis. Social History Never smoker, Marital Status - Married, Alcohol Use - Moderate, Drug Use - No History, Caffeine Use - Moderate. Medical History Eyes Denies history of Cataracts, Glaucoma, Optic Neuritis Ear/Nose/Mouth/Throat Denies history of Chronic sinus problems/congestion,  Middle ear problems Hematologic/Lymphatic Denies history of Anemia, Hemophilia, Human Immunodeficiency Virus, Lymphedema, Sickle Cell Disease Respiratory Denies history of Aspiration, Asthma, Chronic Obstructive Pulmonary Disease (COPD), Pneumothorax, Sleep Apnea, Tuberculosis Cardiovascular Patient has history of Hypertension Denies history of Angina, Arrhythmia, Congestive Heart Failure, Coronary Artery Disease, Deep Vein Thrombosis, Hypotension, Myocardial Infarction, Peripheral Arterial Disease, Peripheral Venous Disease, Phlebitis, Vasculitis Gastrointestinal Denies history of Cirrhosis , Colitis, Crohnoos, Hepatitis A, Hepatitis B, Hepatitis C Endocrine Denies history of Type I Diabetes, Type II Diabetes Genitourinary Denies history of End Stage Renal Disease Immunological Denies history of Lupus Erythematosus, Raynaudoos, Scleroderma Musculoskeletal Denies history of Gout, Rheumatoid Arthritis, Osteoarthritis, Osteomyelitis Neurologic Denies history of Dementia, Neuropathy, Quadriplegia, Paraplegia, Seizure Disorder Oncologic Denies history of Received Chemotherapy, Received  Radiation Hospitalization/Surgery History - appedectomy. - gastric bypass, 2016. - hiatal hernia repair, 2016. Medical A Surgical History Notes nd Gastrointestinal GERD Psychiatric Depression and Anxiety Review of Systems (ROS) Constitutional Symptoms (General Health) Denies complaints or symptoms of Fatigue, Fever, Chills, Marked Weight Change. Eyes Denies complaints or symptoms of Dry Eyes, Vision Changes, Glasses / Contacts. Ear/Nose/Mouth/Throat Denies complaints or symptoms of Chronic sinus problems or rhinitis. Respiratory Denies complaints or symptoms of Chronic or frequent coughs, Shortness of Breath. Cardiovascular Denies complaints or symptoms of Chest pain. Gastrointestinal Denies complaints or symptoms of Frequent diarrhea, Nausea, Vomiting. Endocrine Denies complaints or symptoms  of Heat/cold intolerance. Genitourinary Denies complaints or symptoms of Frequent urination. Integumentary (Skin) left leg Musculoskeletal Denies complaints or symptoms of Muscle Pain, Muscle Weakness. Neurologic Denies complaints or symptoms of Numbness/parasthesias. Objective Constitutional patient is hypertensive.. pulse regular and within target range for patient.Marland Kitchen respirations regular, non-labored and within target range for patient.Marland Kitchen temperature within target range for patient.. Well-nourished and well-hydrated in no acute distress. Vitals Time Taken: 9:30 AM, Height: 69 in, Source: Stated, Weight: 283 lbs, Source: Stated, BMI: 41.8, Temperature: 98.3 F, Pulse: 80 bpm, Respiratory Rate: 18 breaths/min, Blood Pressure: 171/97 mmHg. Eyes conjunctiva clear no eyelid edema noted. pupils equal round and reactive to light and accommodation. Ears, Nose, Mouth, and Throat no gross abnormality of ear auricles or external auditory canals. normal hearing noted during conversation. mucus membranes moist. Respiratory normal breathing without difficulty. Cardiovascular 2+ dorsalis pedis/posterior tibialis pulses. 1+ pitting edema of the bilateral lower extremities. Musculoskeletal normal gait and posture. no significant deformity or arthritic changes, no loss or range of motion, no clubbing. Psychiatric this patient is able to make decisions and demonstrates good insight into disease process. Alert and Oriented x 3. pleasant and cooperative. General Notes: Upon inspection patient's wound bed actually showed signs of necrotic tissue over the surface of the wound including eschar. Subsequently she did undergo a sharp debridement which we discussed and she approved today as well after signing consent. I was able to gently debride away the surface of the wound which included eschar along with some not high-quality granulation tissue underneath down to fairly good granulation tissue. The patient  tolerated that today without complication post debridement the wound bed appears to be doing much better and I am very pleased in this regard. Integumentary (Hair, Skin) Wound #1 status is Open. Original cause of wound was Trauma. The wound is located on the Left,Anterior Lower Leg. The wound measures 1.7cm length x 1.9cm width x 0.2cm depth; 2.537cm^2 area and 0.507cm^3 volume. There is no tunneling or undermining noted. There is a small amount of serous drainage noted. The wound margin is distinct with the outline attached to the wound base. There is no granulation within the wound bed. There is a large (67-100%) amount of necrotic tissue within the wound bed including Eschar and Adherent Slough. Assessment Active Problems ICD-10 Venous insufficiency (chronic) (peripheral) Unspecified open wound, left lower leg, initial encounter Non-pressure chronic ulcer of other part of left lower leg with fat layer exposed Essential (primary) hypertension Procedures Wound #1 Pre-procedure diagnosis of Wound #1 is a Trauma, Other located on the Left,Anterior Lower Leg . There was a Excisional Skin/Subcutaneous Tissue Debridement with a total area of 3.23 sq cm performed by Worthy Keeler, PA. With the following instrument(s): Curette to remove Viable and Non-Viable tissue/material. Material removed includes Eschar, Subcutaneous Tissue, and Fibrin/Exudate after achieving pain control using Other (benzocaine, 20%). No specimens were taken. A time  out was conducted at 10:30, prior to the start of the procedure. A Minimum amount of bleeding was controlled with Pressure. The procedure was tolerated well with a pain level of 3 throughout and a pain level of 1 following the procedure. Post Debridement Measurements: 1.7cm length x 1.9cm width x 0.1cm depth; 0.254cm^3 volume. Character of Wound/Ulcer Post Debridement is improved. Post procedure Diagnosis Wound #1: Same as Pre-Procedure Pre-procedure diagnosis of  Wound #1 is a Trauma, Other located on the Left,Anterior Lower Leg . There was a Three Layer Compression Therapy Procedure by Carlene Coria, RN. Post procedure Diagnosis Wound #1: Same as Pre-Procedure Plan Follow-up Appointments: Return Appointment in 1 week. Dressing Change Frequency: Do not change entire dressing for one week. Skin Barriers/Peri-Wound Care: Wound #1 Left,Anterior Lower Leg: Moisturizing lotion - to leg Wound Cleansing: May shower with protection. Primary Wound Dressing: Wound #1 Left,Anterior Lower Leg: Hydrofera Blue - classic moistened with saline Secondary Dressing: Wound #1 Left,Anterior Lower Leg: Dry Gauze Edema Control: 3 Layer Compression System - Left Lower Extremity Avoid standing for long periods of time Elevate legs to the level of the heart or above for 30 minutes daily and/or when sitting, a frequency of: - throughout the day Exercise regularly The following medication(s) was prescribed: benzocaine topical 20 % aerosol aerosol topical for prior to debridement was prescribed at facility 1. I would recommend currently that we go ahead and initiate treatment with the [facility] dressing. I think this is probably to be the best option for the patient at this time. 2. Also recommend that we go ahead and initiate treatment with a 3 layer compression wrap to try to keep the edema under good control. 3. I would also recommend that we have the patient elevate her legs much as possible throughout the day and exercise regularly. I think both of these things can be beneficial the worst thing is sitting for long periods of time on her feet on the floor or standing long periods of time. She states at work she mainly sits but she can elevate her leg which is great news. We will see patient back for reevaluation in 1 week here in the clinic. If anything worsens or changes patient will contact our office for additional recommendations. Electronic  Signature(s) Signed: 05/09/2020 10:43:32 AM By: Worthy Keeler PA-C Entered By: Worthy Keeler on 05/09/2020 10:43:31 -------------------------------------------------------------------------------- HxROS Details Patient Name: Date of Service: Alexandra Petty 05/09/2020 9:00 A M Medical Record Number: [id number] Patient Account Number: [id number] Date of Birth/Sex: Treating RN: Mar 11, 1981 (39 y.o. Clearnce Sorrel Primary Care Provider: [place] Billey Gosling RD Other Clinician: Referring Provider: Treating Provider/Extender: Aniceto Boss, ZO E Weeks in Treatment: 0 Information Obtained From Patient Constitutional Symptoms (General Health) Complaints and Symptoms: Negative for: Fatigue; Fever; Chills; Marked Weight Change Eyes Complaints and Symptoms: Negative for: Dry Eyes; Vision Changes; Glasses / Contacts Medical History: Negative for: Cataracts; Glaucoma; Optic Neuritis Ear/Nose/Mouth/Throat Complaints and Symptoms: Negative for: Chronic sinus problems or rhinitis Medical History: Negative for: Chronic sinus problems/congestion; Middle ear problems Respiratory Complaints and Symptoms: Negative for: Chronic or frequent coughs; Shortness of Breath Medical History: Negative for: Aspiration; Asthma; Chronic Obstructive Pulmonary Disease (COPD); Pneumothorax; Sleep Apnea; Tuberculosis Cardiovascular Complaints and Symptoms: Negative for: Chest pain Medical History: Positive for: Hypertension Negative for: Angina; Arrhythmia; Congestive Heart Failure; Coronary Artery Disease; Deep Vein Thrombosis; Hypotension; Myocardial Infarction; Peripheral Arterial Disease; Peripheral Venous Disease; Phlebitis; Vasculitis Gastrointestinal Complaints and Symptoms: Negative for: Frequent  diarrhea; Nausea; Vomiting Medical History: Negative for: Cirrhosis ; Colitis; Crohns; Hepatitis A; Hepatitis B; Hepatitis C Past Medical History Notes: GERD Endocrine Complaints and  Symptoms: Negative for: Heat/cold intolerance Medical History: Negative for: Type I Diabetes; Type II Diabetes Genitourinary Complaints and Symptoms: Negative for: Frequent urination Medical History: Negative for: End Stage Renal Disease Musculoskeletal Complaints and Symptoms: Negative for: Muscle Pain; Muscle Weakness Medical History: Negative for: Gout; Rheumatoid Arthritis; Osteoarthritis; Osteomyelitis Neurologic Complaints and Symptoms: Negative for: Numbness/parasthesias Medical History: Negative for: Dementia; Neuropathy; Quadriplegia; Paraplegia; Seizure Disorder Hematologic/Lymphatic Medical History: Negative for: Anemia; Hemophilia; Human Immunodeficiency Virus; Lymphedema; Sickle Cell Disease Immunological Medical History: Negative for: Lupus Erythematosus; Raynauds; Scleroderma Integumentary (Skin) Complaints and Symptoms: Review of System Notes: left leg Oncologic Medical History: Negative for: Received Chemotherapy; Received Radiation Psychiatric Medical History: Past Medical History Notes: Depression and Anxiety Immunizations Pneumococcal Vaccine: Received Pneumococcal Vaccination: No Implantable Devices None Hospitalization / Surgery History Type of Hospitalization/Surgery appedectomy gastric bypass, 2016 hiatal hernia repair, 2016 Family and Social History Cancer: Yes - Mother; Diabetes: No; Heart Disease: Yes - Father; Hereditary Spherocytosis: No; Hypertension: Yes - Father; Kidney Disease: No; Lung Disease: No; Seizures: No; Stroke: No; Thyroid Problems: No; Tuberculosis: No; Never smoker; Marital Status - Married; Alcohol Use: Moderate; Drug Use: No History; Caffeine Use: Moderate; Financial Concerns: No; Food, Clothing or Shelter Needs: No; Support System Lacking: No; Transportation Concerns: No Electronic Signature(s) Signed: 05/09/2020 4:50:50 PM By: Worthy Keeler PA-C Signed: 05/09/2020 5:19:53 PM By: Kela Millin Entered By:  Kela Millin on 05/09/2020 09:36:45 -------------------------------------------------------------------------------- SuperBill Details Patient Name: Date of Service: Alexandra Petty 05/09/2020 Medical Record Number: [id number] Patient Account Number: [id number] Date of Birth/Sex: Treating RN: [birth year] (39 y.o. [name] Primary Care Provider: [place] Billey Gosling RD Other Clinician: Referring Provider: Treating Provider/Extender: Aniceto Boss, ZO E Weeks in Treatment: 0 Diagnosis Coding ICD-10 Codes Code Description [phone] Unspecified open wound, left lower leg, initial encounter L97.822 Non-pressure chronic ulcer of other part of left lower leg with fat layer exposed [place] (primary) hypertension Facility Procedures CPT4 Code: [id number] Description: [place] VISIT-LEV 3 EST PT Modifier: 25 Quantity: 1 CPT4 Code: [id number] Description: [id number] - DEB SUBQ TISSUE 20 SQ CM/< ICD-10 Diagnosis Description L97.822 Non-pressure chronic ulcer of other part of left lower leg with fat layer expos Modifier: ed Quantity: 1 Physician Procedures : CPT4 Code Description Modifier [id number] WC PHYS LEVEL 3 NEW PT 25 ICD-10 Diagnosis Description S81.802A Unspecified open wound, left lower leg, initial encounter L97.822 Non-pressure chronic ulcer of other part of left lower leg with fat layer exposed  I10 Essential (primary) hypertension Quantity: 1 : [id number] - WC PHYS SUBQ TISS 20 SQ CM 1 ICD-10 Diagnosis Description L97.822 Non-pressure chronic ulcer of other part of left lower leg with fat layer exposed Quantity: Electronic Signature(s) Signed: 05/09/2020 10:43:47 AM By: Worthy Keeler PA-C Entered By: Worthy Keeler on 05/09/2020 10:43:46

## 2020-05-09 NOTE — Progress Notes (Signed)
Alexandra Petty, Alexandra Petty ([id number]) Visit Report for 05/09/2020 Allergy List Details Patient Name: Date of Service: Alexandra Petty, Alexandra Petty 05/09/2020 9:00 A M Medical Record Number: [id number] Patient Account Number: [id number] Date of Birth/Sex: Treating RN: 17-Nov-1981 (39 y.o. Alexandra Petty Primary Care Buck Mcaffee: [place] Billey Gosling RD Other Clinician: Referring Froylan Hobby: Treating Beula Joyner/Extender: Aniceto Boss, ZO E Weeks in Treatment: 0 Allergies Active Allergies No Known Allergies Allergy Notes Electronic Signature(s) Signed: 05/09/2020 5:19:53 PM By: Kela Millin Entered By: Kela Millin on 05/09/2020 09:31:47 -------------------------------------------------------------------------------- Arrival Information Details Patient Name: Date of Service: Alexandra Kern MA Petty 05/09/2020 9:00 A M Medical Record Number: [id number] Patient Account Number: [id number] Date of Birth/Sex: Treating RN: 02-18-1981 (39 y.o. Alexandra Petty Primary Care Mamoudou Mulvehill: [place] Billey Gosling RD Other Clinician: Referring Harlem Bula: Treating Trenity Pha/Extender: Aniceto Boss, ZO E Weeks in Treatment: 0 Visit Information Patient Arrived: Ambulatory Arrival Time: 09:30 Accompanied By: self Transfer Assistance: None Patient Identification Verified: Yes Secondary Verification Process Completed: Yes Patient Has Alerts: Yes Patient Alerts: Left ABI: 0.99 Electronic Signature(s) Signed: 05/09/2020 5:19:53 PM By: Kela Millin Entered By: Kela Millin on 05/09/2020 09:58:53 -------------------------------------------------------------------------------- Clinic Level of Care Assessment Details Patient Name: Date of Service: Alexandra Petty, Alexandra Petty 05/09/2020 9:00 A M Medical Record Number: [id number] Patient Account Number: [id number] Date of Birth/Sex: Treating RN: 06/04/1981 (39 y.o. Alexandra Petty Primary Care Nymir Ringler: [place] Billey Gosling RD Other Clinician: Referring  Matti Minney: Treating Kesia Dalto/Extender: Aniceto Boss, ZO E Weeks in Treatment: 0 Clinic Level of Care Assessment Items TOOL 1 Quantity Score []  - 0 Use when EandM and Procedure is performed on INITIAL visit ASSESSMENTS - Nursing Assessment / Reassessment X- 1 20 General Physical Exam (combine w/ comprehensive assessment (listed just below) when performed on new pt. evals) X- 1 25 Comprehensive Assessment (HX, ROS, Risk Assessments, Wounds Hx, etc.) ASSESSMENTS - Wound and Skin Assessment / Reassessment []  - 0 Dermatologic / Skin Assessment (not related to wound area) ASSESSMENTS - Ostomy and/or Continence Assessment and Care []  - 0 Incontinence Assessment and Management []  - 0 Ostomy Care Assessment and Management (repouching, etc.) PROCESS - Coordination of Care X - Simple Patient / Family Education for ongoing care 1 15 []  - 0 Complex (extensive) Patient / Family Education for ongoing care X- 1 10 Staff obtains Programmer, systems, Records, T Results / Process Orders est []  - 0 Staff telephones HHA, Nursing Homes / Clarify orders / etc []  - 0 Routine Transfer to another Facility (non-emergent condition) []  - 0 Routine Hospital Admission (non-emergent condition) X- 1 15 New Admissions / Biomedical engineer / Ordering NPWT Apligraf, etc. , []  - 0 Emergency Hospital Admission (emergent condition) PROCESS - Special Needs []  - 0 Pediatric / Minor Patient Management []  - 0 Isolation Patient Management []  - 0 Hearing / Language / Visual special needs []  - 0 Assessment of Community assistance (transportation, D/C planning, etc.) []  - 0 Additional assistance / Altered mentation []  - 0 Support Surface(s) Assessment (bed, cushion, seat, etc.) INTERVENTIONS - Miscellaneous []  - 0 External ear exam []  - 0 Patient Transfer (multiple staff / Civil Service fast streamer / Similar devices) []  - 0 Simple Staple / Suture removal (25 or less) []  - 0 Complex Staple / Suture removal (26  or more) []  - 0 Hypo/Hyperglycemic Management (do not check if billed separately) X- 1 15 Ankle / Brachial Index (ABI) - do not check if billed separately Has the patient been seen at the  hospital within the last three years: Yes Total Score: 100 Level Of Care: New/Established - Level 3 Electronic Signature(s) Signed: 05/09/2020 5:52:33 PM By: Baruch Gouty RN, BSN Entered By: Baruch Gouty on 05/09/2020 10:32:00 -------------------------------------------------------------------------------- Compression Therapy Details Patient Name: Date of Service: Alexandra Kern MA Petty 05/09/2020 9:00 [place] Record Number: [id number] Patient Account Number: [id number] Date of Birth/Sex: Treating RN: April 02, 1981 (39 y.o. Alexandra Petty Primary Care Aliene Tamura: [place] Billey Gosling RD Other Clinician: Referring Adrionna Delcid: Treating Kimberly Nieland/Extender: Aniceto Boss, ZO E Weeks in Treatment: 0 Compression Therapy Performed for Wound Assessment: Wound #1 Left,Anterior Lower Leg Performed By: Clinician Carlene Coria, RN Compression Type: Three Layer Post Procedure Diagnosis Same as Pre-procedure Electronic Signature(s) Signed: 05/09/2020 5:52:33 PM By: Baruch Gouty RN, BSN Entered By: Baruch Gouty on 05/09/2020 10:35:34 -------------------------------------------------------------------------------- Encounter Discharge Information Details Patient Name: Date of Service: Alexandra Kern MA Petty 05/09/2020 9:00 [place] Record Number: [id number] Patient Account Number: [id number] Date of Birth/Sex: Treating RN: [birth year] (39 y.o. Alexandra Petty Primary Care Camilo Mander: [place] Billey Gosling RD Other Clinician: Referring Xzaviar Maloof: Treating Jerae Izard/Extender: Aniceto Boss, ZO E Weeks in Treatment: 0 Encounter Discharge Information Items Post Procedure Vitals Discharge Condition: Stable Temperature (F): 98.3 Ambulatory Status: Ambulatory Pulse (bpm): 80 Discharge Destination:  Home Respiratory Rate (breaths/min): 18 Transportation: Private Auto Blood Pressure (mmHg): 171/97 Accompanied By: self Schedule Follow-up Appointment: Yes Clinical Summary of Care: Patient Declined Electronic Signature(s) Signed: 05/09/2020 4:36:43 PM By: Carlene Coria RN Entered By: Carlene Coria on 05/09/2020 11:00:23 -------------------------------------------------------------------------------- Lower Extremity Assessment Details Patient Name: Date of Service: Alexandra Petty, Alexandra Petty 05/09/2020 9:00 A M Medical Record Number: [id number] Patient Account Number: [id number] Date of Birth/Sex: Treating RN: Feb 26, 1981 (60 y.o. Alexandra Petty Primary Care Seretha Estabrooks: [place], [address] Other Clinician: Referring Laprecious Austill: Treating Ladavia Lindenbaum/Extender: Aniceto Boss, ZO E Weeks in Treatment: 0 Edema Assessment Assessed: [Left: No] [Right: No] E[Left: dema] [Right: :] Calf Left: Right: Point of Measurement: 30 cm From Medial Instep 47 cm cm Ankle Left: Right: Point of Measurement: 12 cm From Medial Instep 25 cm cm Vascular Assessment Pulses: Dorsalis Pedis Palpable: [Left:Yes] Blood Pressure: Brachial: [Left:171] Ankle: [Left:Dorsalis Pedis: 170 0.99] Electronic Signature(s) Signed: 05/09/2020 5:19:53 PM By: Kela Millin Entered By: Kela Millin on 05/09/2020 09:58:29 -------------------------------------------------------------------------------- Multi-Disciplinary Care Plan Details Patient Name: Date of Service: Alexandra Kern MA Petty 05/09/2020 9:00 A M Medical Record Number: [id number] Patient Account Number: [id number] Date of Birth/Sex: Treating RN: 07/10/1981 (32 y.o. Alexandra Petty Primary Care Buford Bremer: [place] Billey Gosling RD Other Clinician: Referring Metzli Pollick: Treating Joylene Wescott/Extender: Aniceto Boss, ZO E Weeks in Treatment: 0 Active Inactive Necrotic Tissue Nursing Diagnoses: Impaired tissue integrity related to  necrotic/devitalized tissue Knowledge deficit related to management of necrotic/devitalized tissue Goals: Necrotic/devitalized tissue will be minimized in the wound bed Date Initiated: 05/09/2020 Target Resolution Date: 06/06/2020 Goal Status: Active Patient/caregiver will verbalize understanding of reason and process for debridement of necrotic tissue Date Initiated: 05/09/2020 Target Resolution Date: 06/06/2020 Goal Status: Active Interventions: Assess patient pain level pre-, during and post procedure and prior to discharge Provide education on necrotic tissue and debridement process Treatment Activities: Apply topical anesthetic as ordered : 05/09/2020 Excisional debridement : 05/09/2020 Notes: Wound/Skin Impairment Nursing Diagnoses: Impaired tissue integrity Knowledge deficit related to ulceration/compromised skin integrity Goals: Patient/caregiver will verbalize understanding of skin care regimen Date Initiated: 05/09/2020 Target Resolution Date: 06/06/2020 Goal Status: Active Ulcer/skin breakdown will have  a volume reduction of 30% by week 4 Date Initiated: 05/09/2020 Target Resolution Date: 06/06/2020 Goal Status: Active Interventions: Assess patient/caregiver ability to obtain necessary supplies Assess patient/caregiver ability to perform ulcer/skin care regimen upon admission and as needed Assess ulceration(s) every visit Provide education on ulcer and skin care Treatment Activities: Skin care regimen initiated : 05/09/2020 Topical wound management initiated : 05/09/2020 Notes: Electronic Signature(s) Signed: 05/09/2020 5:52:33 PM By: Baruch Gouty RN, BSN Entered By: Baruch Gouty on 05/09/2020 10:30:31 -------------------------------------------------------------------------------- Pain Assessment Details Patient Name: Date of Service: Alexandra Kern MA Petty 05/09/2020 9:00 [place] Record Number: [id number] Patient Account Number: [id number] Date of Birth/Sex: Treating  RN: Oct 17, 1981 (43 y.o. Alexandra Petty Primary Care Ahsley Attwood: [place] Billey Gosling RD Other Clinician: Referring Ishmail Mcmanamon: Treating Ruben Pyka/Extender: Aniceto Boss, ZO E Weeks in Treatment: 0 Active Problems Location of Pain Severity and Description of Pain Patient Has Paino No Site Locations Pain Management and Medication Current Pain Management: Electronic Signature(s) Signed: 05/09/2020 5:19:53 PM By: Kela Millin Entered By: Kela Millin on 05/09/2020 09:44:16 -------------------------------------------------------------------------------- Patient/Caregiver Education Details Patient Name: Date of Service: Alexandra Kern MA Petty 6/2/2021andnbsp9:00 A M Medical Record Number: [id number] Patient Account Number: [id number] Date of Birth/Gender: Treating RN: 10/09/1981 (1 y.o. Alexandra Petty Primary Care Physician: [place] Billey Gosling RD Other Clinician: Referring Physician: Treating Physician/Extender: Aniceto Boss, ZO E Weeks in Treatment: 0 Education Assessment Education Provided To: Patient Education Topics Provided [place]: o Handouts: Welcome T [facility] o Methods: Explain/Verbal, Printed Responses: Reinforcements needed, State content correctly Wound Debridement: Handouts: Wound Debridement Methods: Explain/Verbal, Printed Responses: Reinforcements needed, State content correctly Wound/Skin Impairment: Handouts: Caring for Your Ulcer, Skin Care Do's and Dont's Methods: Explain/Verbal, Printed Responses: Reinforcements needed, State content correctly Electronic Signature(s) Signed: 05/09/2020 5:52:33 PM By: Baruch Gouty RN, BSN Entered By: Baruch Gouty on 05/09/2020 10:31:17 -------------------------------------------------------------------------------- Wound Assessment Details Patient Name: Date of Service: Alexandra Kern MA Petty 05/09/2020 9:00 [place] Record Number: [id number] Patient  Account Number: [id number] Date of Birth/Sex: Treating RN: September 24, 1981 (26 y.o. Alexandra Petty Primary Care Mehdi Gironda: [place] Billey Gosling RD Other Clinician: Referring Mayer Vondrak: Treating Ramon Zanders/Extender: Aniceto Boss, ZO E Weeks in Treatment: 0 Wound Status Wound Number: 1 Primary Etiology: Trauma, Other Wound Location: Left, Anterior Lower Leg Wound Status: Open Wounding Event: Trauma Comorbid History: Hypertension Date Acquired: 10/09/2019 Weeks Of Treatment: 0 Clustered Wound: No Wound Measurements Length: (cm) 1.7 Width: (cm) 1.9 Depth: (cm) 0.2 Area: (cm) 2.537 Volume: (cm) 0.507 % Reduction in Area: % Reduction in Volume: Epithelialization: None Tunneling: No Undermining: No Wound Description Classification: Unclassifiable Wound Margin: Distinct, outline attached Exudate Amount: Small Exudate Type: Serous Exudate Color: amber Foul Odor After Cleansing: No Slough/Fibrino Yes Wound Bed Granulation Amount: None Present (0%) Exposed Structure Necrotic Amount: Large (67-100%) Fascia Exposed: No Necrotic Quality: Eschar, Adherent Slough Fat Layer (Subcutaneous Tissue) Exposed: No Tendon Exposed: No Muscle Exposed: No Joint Exposed: No Bone Exposed: No Electronic Signature(s) Signed: 05/09/2020 5:19:53 PM By: Kela Millin Entered By: Kela Millin on 05/09/2020 09:44:07 -------------------------------------------------------------------------------- [place] Details Patient Name: Date of Service: Alexandra Kern MA Petty 05/09/2020 9:00 A M Medical Record Number: [id number] Patient Account Number: [id number] Date of Birth/Sex: Treating RN: 08/29/1981 (48 y.o. Alexandra Petty Primary Care Mcclain Shall: [place] Billey Gosling RD Other Clinician: Referring Moet Mikulski: Treating Jedidiah Demartini/Extender: Aniceto Boss, ZO E Weeks in Treatment: 0 Vital Signs Time  Taken: 09:30 Temperature (F): 98.3 Height (in): 69 Pulse (bpm): 80 Source:  Stated Respiratory Rate (breaths/min): 18 Weight (lbs): 283 Blood Pressure (mmHg): 171/97 Source: Stated Reference Range: 80 - 120 mg / dl Body Mass Index (BMI): 41.8 Electronic Signature(s) Signed: 05/09/2020 5:19:53 PM By: Kela Millin Entered By: Kela Millin on 05/09/2020 09:31:29

## 2020-05-09 NOTE — Progress Notes (Signed)
Alexandra Petty, Alexandra Petty ([id number]) Visit Report for 05/09/2020 Abuse/Suicide Risk Screen Details Patient Name: Date of Service: Alexandra, SEEDS MA Petty 05/09/2020 9:00 A M Medical Record Number: [id number] Patient Account Number: [id number] Date of Birth/Sex: Treating RN: [birth year] (39 y.o. Clearnce Sorrel Primary Care Latif Nazareno: [place] Billey Gosling RD Other Clinician: Referring Hebah Bogosian: Treating Brewster Wolters/Extender: Aniceto Boss, ZO E Weeks in Treatment: 0 Abuse/Suicide Risk Screen Items Answer ABUSE RISK SCREEN: Has anyone close to you tried to hurt or harm you recentlyo No Do you feel uncomfortable with anyone in your familyo No Has anyone forced you do things that you didnt want to doo No Electronic Signature(s) Signed: 05/09/2020 5:19:53 PM By: Kela Millin Entered By: Kela Millin on 05/09/2020 09:36:53 -------------------------------------------------------------------------------- Activities of Daily Living Details Patient Name: Date of Service: Alexandra, SEDLAR MA Petty 05/09/2020 9:00 [place] Record Number: [id number] Patient Account Number: [id number] Date of Birth/Sex: Treating RN: 08-02-1981 (39 y.o. Clearnce Sorrel Primary Care Skarleth Delmonico: [place] Billey Gosling RD Other Clinician: Referring Matthew Pais: Treating Brynlee Pennywell/Extender: Aniceto Boss, ZO E Weeks in Treatment: 0 Activities of Daily Living Items Answer Activities of Daily Living (Please select one for each item) Drive Automobile Completely Able T Medications ake Completely Able Use T elephone Completely Able Care for Appearance Completely Able Use T oilet Completely Able Bath / Shower Completely Able Dress Self Completely Able Feed Self Completely Able Walk Completely Able Get In / Out Bed Completely Able Housework Completely Able Prepare Meals Completely Able Handle Money Completely Able Shop for Self Completely Able Electronic Signature(s) Signed: 05/09/2020 5:19:53 PM By: Kela Millin Entered By: Kela Millin on 05/09/2020 09:37:15 -------------------------------------------------------------------------------- Education Screening Details Patient Name: Date of Service: Alexandra Petty 05/09/2020 9:00 [place] Record Number: [id number] Patient Account Number: [id number] Date of Birth/Sex: Treating RN: 1981/05/04 (39 y.o. Clearnce Sorrel Primary Care Loreto Loescher: [place] Billey Gosling RD Other Clinician: Referring Jalil Lorusso: Treating Maisha Bogen/Extender: Aniceto Boss, ZO E Weeks in Treatment: 0 Primary Learner Assessed: Patient Learning Preferences/Education Level/Primary Language Learning Preference: Explanation Highest Education Level: College or Above Preferred Language: English Cognitive Barrier Language Barrier: No Translator Needed: No Memory Deficit: No Emotional Barrier: No Cultural/Religious Beliefs Affecting Medical Care: No Physical Barrier Impaired Vision: No Impaired Hearing: No Decreased Hand dexterity: No Knowledge/Comprehension Knowledge Level: High Comprehension Level: High Ability to understand written instructions: High Ability to understand verbal instructions: High Motivation Anxiety Level: Calm Cooperation: Cooperative Education Importance: Acknowledges Need Interest in Health Problems: Asks Questions Perception: Coherent Willingness to Engage in Self-Management High Activities: Readiness to Engage in Self-Management High Activities: Electronic Signature(s) Signed: 05/09/2020 5:19:53 PM By: Kela Millin Entered By: Kela Millin on 05/09/2020 09:37:57 -------------------------------------------------------------------------------- Fall Risk Assessment Details Patient Name: Date of Service: Alexandra Petty 05/09/2020 9:00 A M Medical Record Number: [id number] Patient Account Number: [id number] Date of Birth/Sex: Treating RN: November 23, 1981 (39 y.o. Clearnce Sorrel Primary Care Abhijay Morriss: [place] Billey Gosling RD Other Clinician: Referring Tahirih Lair: Treating Sarena Jezek/Extender: Aniceto Boss, ZO E Weeks in Treatment: 0 Fall Risk Assessment Items Have you had 2 or more falls in the last 12 monthso 0 No Have you had any fall that resulted in injury in the last 12 monthso 0 Yes FALLS RISK SCREEN History of falling - immediate or within 3 months 0 No Secondary diagnosis (Do you have 2 or more medical diagnoseso) 0 No Ambulatory aid None/bed rest/wheelchair/nurse 0 Yes Crutches/cane/walker 0 No Furniture  0 No Intravenous therapy Access/Saline/Heparin Lock 0 No Gait/Transferring Normal/ bed rest/ wheelchair 0 Yes Weak (short steps with or without shuffle, stooped but able to lift head while walking, may seek 0 No support from furniture) Impaired (short steps with shuffle, may have difficulty arising from chair, head down, impaired 0 No balance) Mental Status Oriented to own ability 0 Yes Electronic Signature(s) Signed: 05/09/2020 5:19:53 PM By: Kela Millin Entered By: Kela Millin on 05/09/2020 09:38:23 -------------------------------------------------------------------------------- Foot Assessment Details Patient Name: Date of Service: Alexandra Petty 05/09/2020 9:00 Alexandra Petty Record Number: Alexandra Petty Patient Account Number: [id number] Date of Birth/Sex: Treating RN: 06-Oct-1981 (39 y.o. Clearnce Sorrel Primary Care Shiro Ellerman: [place] Billey Gosling RD Other Clinician: Referring Jadier Rockers: Treating Roneshia Drew/Extender: Aniceto Boss, ZO E Weeks in Treatment: 0 Foot Assessment Items Site Locations + = Sensation present, - = Sensation absent, C = Callus, U = Ulcer R = Redness, W = Warmth, M = Maceration, PU = Pre-ulcerative lesion F = Fissure, S = Swelling, D = Dryness Assessment Right: Left: Other Deformity: No No Prior Foot Ulcer: No No Prior Amputation: No No Charcot Joint: No No Ambulatory Status: Ambulatory Without Help Gait:  Steady Electronic Signature(s) Signed: 05/09/2020 5:19:53 PM By: Kela Millin Entered By: Kela Millin on 05/09/2020 09:38:44 -------------------------------------------------------------------------------- Nutrition Risk Screening Details Patient Name: Date of Service: Alexandra, TELEP MA Petty 05/09/2020 9:00 A M Medical Record Number: Alexandra Petty Patient Account Number: [id number] Date of Birth/Sex: Treating RN: 1981-02-27 (39 y.o. Clearnce Sorrel Primary Care Letonya Mangels: [place], [address] RD Other Clinician: Referring Carter Kassel: Treating Dak Szumski/Extender: Aniceto Boss, ZO E Weeks in Treatment: 0 Height (in): 69 Weight (lbs): 283 Body Mass Index (BMI): 41.8 Nutrition Risk Screening Items Score Screening NUTRITION RISK SCREEN: I have an illness or condition that made me change the kind and/or amount of food I eat 0 No I eat fewer than two meals per day 0 No I eat few fruits and vegetables, or milk products 0 No I have three or more drinks of beer, liquor or wine almost every day 0 No I have tooth or mouth problems that make it hard for me to eat 0 No I don't always have enough money to buy the food I need 0 No I eat alone most of the time 0 No I take three or more different prescribed or over-the-counter drugs a day 0 No Without wanting to, I have lost or gained 10 pounds in the last six months 0 No I am not always physically able to shop, cook and/or feed myself 0 No Nutrition Protocols Good Risk Protocol 0 No interventions needed Moderate Risk Protocol High Risk Proctocol Risk Level: Good Risk Score: 0 Electronic Signature(s) Signed: 05/09/2020 5:19:53 PM By: Kela Millin Entered By: Kela Millin on 05/09/2020 09:38:34

## 2020-05-16 ENCOUNTER — Encounter (HOSPITAL_BASED_OUTPATIENT_CLINIC_OR_DEPARTMENT_OTHER): Payer: 59 | Admitting: Physician Assistant

## 2020-05-16 ENCOUNTER — Other Ambulatory Visit: Payer: Self-pay

## 2020-05-16 DIAGNOSIS — I10 Essential (primary) hypertension: Secondary | ICD-10-CM | POA: Diagnosis not present

## 2020-05-16 DIAGNOSIS — L97822 Non-pressure chronic ulcer of other part of left lower leg with fat layer exposed: Secondary | ICD-10-CM | POA: Diagnosis not present

## 2020-05-16 DIAGNOSIS — Z809 Family history of malignant neoplasm, unspecified: Secondary | ICD-10-CM | POA: Diagnosis not present

## 2020-05-16 DIAGNOSIS — Z8249 Family history of ischemic heart disease and other diseases of the circulatory system: Secondary | ICD-10-CM | POA: Diagnosis not present

## 2020-05-16 DIAGNOSIS — I872 Venous insufficiency (chronic) (peripheral): Secondary | ICD-10-CM | POA: Diagnosis not present

## 2020-05-16 NOTE — Progress Notes (Addendum)
BABITA, AMAKER ([id number]) Visit Report for 05/16/2020 Chief Complaint Document Details Patient Name: Date of Service: MALYA, CIRILLO [place] NDA 05/16/2020 3:15 PM Medical Record Number: [id number] Patient Account Number: [id number] Date of Birth/Sex: Treating RN: 06-23-1981 (39 y.o. [name] Primary Care Provider: MO Billey Gosling RD Other Clinician: Referring Provider: Treating Provider/Extender: Albertine Grates RD Weeks in Treatment: 1 Information Obtained from: Patient Chief Complaint Left leg ulcer Electronic Signature(s) Signed: 05/16/2020 3:53:45 PM By: Worthy Keeler PA-C Entered By: Worthy Keeler on 05/16/2020 15:53:45 -------------------------------------------------------------------------------- HPI Details Patient Name: Date of Service: [name] 05/16/2020 3:15 PM Medical Record Number: [id number] Patient Account Number: [id number] Date of Birth/Sex: Treating RN: [birth year] (39 y.o. [name] Primary Care Provider: MO Billey Gosling RD Other Clinician: Referring Provider: Treating Provider/Extender: Worthy Keeler [place] Billey Gosling RD Weeks in Treatment: 1 History of Present Illness HPI Description: 05/09/2020 upon evaluation today patient actually appears to be doing somewhat poorly in regard to her left lower extremity anteriorly. She states that she had an injury where she fell down her basement stairs on November 2020. Subsequently she developed initially bruising and then a hematoma underneath of the necrosis of tissue over the surface of the hematoma. In the matter of months since that time she has gone to several providers and most recently her primary care provider on May 14 who did do a culture initially she was placed on Bactrim that was subsequently switched to Cipro based on the Pseudomonas culture. Nonetheless the patient seems to be doing much better and states the pain has been much better since that time. Fortunately there is no  evidence of active infection which is great news systemically or locally. There does not appear to be signs of overall worsening in my opinion based on what we see. She does have hypertension and does appear to have some lower extremity edema as well but otherwise I do not see any evidence that she has major medical problems contributing to this other than just the fact that she had underlying hematoma. 05/16/2020 upon evaluation today patient appears to be doing well visually except for the wound is slightly larger than what it was last week. She unfortunately had a lot of drainage including some odor as well that has her somewhat concerned. Fortunately there is no signs of active infection at this time. No fever chills noted. Infected actually appears to be less erythematous than last week nonetheless I still am not opposed to putting her on an antibiotic to try to help take care of this. The overall size of the wound is actually smaller as compared to previous which is good news. Electronic Signature(s) Signed: 05/16/2020 4:58:09 PM By: Worthy Keeler PA-C Entered By: Worthy Keeler on 05/16/2020 16:58:08 -------------------------------------------------------------------------------- Physical Exam Details Patient Name: Date of Service: CHERITA, HEBEL MA NDA 05/16/2020 3:15 PM Medical Record Number: [id number] Patient Account Number: [id number] Date of Birth/Sex: Treating RN: 1981/02/11 (39 y.o. [name] Primary Care Provider: MO Billey Gosling RD Other Clinician: Referring Provider: Treating Provider/Extender: Worthy Keeler MO RRO Kristopher Oppenheim RD Weeks in Treatment: 1 Constitutional Well-nourished and well-hydrated in no acute distress. Respiratory normal breathing without difficulty. Psychiatric this patient is able to make decisions and demonstrates good insight into disease process. Alert and Oriented x 3. pleasant and cooperative. Notes Her wound currently actually showed signs of  good granulation progress although using the island dressing over the  past couple of days probably made this look not quite as good as the picture she shows me nonetheless overall of her leg there is some improvement here. Overall the measurements are better. Electronic Signature(s) Signed: 05/16/2020 4:58:52 PM By: Worthy Keeler PA-C Entered By: Worthy Keeler on 05/16/2020 16:58:51 -------------------------------------------------------------------------------- Physician Orders Details Patient Name: Date of Service: [name] 05/16/2020 3:15 PM Medical Record Number: [id number] Patient Account Number: [id number] Date of Birth/Sex: Treating RN: 17-Apr-1981 (39 y.o. [name] Primary Care Provider: [place] Billey Gosling RD Other Clinician: Referring Provider: Treating Provider/Extender: Worthy Keeler [place] Billey Gosling RD Weeks in Treatment: 1 Verbal / Phone Orders: No Diagnosis Coding ICD-10 Coding Code Description I87.2 Venous insufficiency (chronic) (peripheral) S81.802A Unspecified open wound, left lower leg, initial encounter L97.822 Non-pressure chronic ulcer of other part of left lower leg with fat layer exposed [place] (primary) hypertension Follow-up Appointments Return Appointment in 1 week. Dressing Change Frequency Do not change entire dressing for one week. Skin Barriers/Peri-Wound Care Wound #1 Left,Anterior Lower Leg Moisturizing lotion - to leg Wound Cleansing May shower with protection. Primary Wound Dressing Wound #1 Left,Anterior Lower Leg Hydrofera Blue - classic moistened with saline Secondary Dressing Wound #1 Left,Anterior Lower Leg Dry Gauze Edema Control 3 Layer Compression System - Left Lower Extremity Avoid standing for long periods of time Elevate legs to the level of the heart or above for 30 minutes daily and/or when sitting, a frequency of: - throughout the day Exercise regularly Laboratory naerobe culture (MICRO) - left lower  leg Bacteria identified in Unspecified specimen by A LOINC Code: 283-1 Convenience Name: Anerobic culture Patient Medications llergies: No Known Allergies A Notifications Medication Indication Start End 05/16/2020 Levaquin DOSE 1 - oral 750 mg tablet - 1 tablet oral taken 1 time per day for 14 days Electronic Signature(s) Signed: 05/16/2020 5:00:37 PM By: Worthy Keeler PA-C Entered By: Worthy Keeler on 05/16/2020 17:00:36 -------------------------------------------------------------------------------- Problem List Details Patient Name: Date of Service: [name] 05/16/2020 3:15 PM Medical Record Number: [id number] Patient Account Number: [id number] Date of Birth/Sex: Treating RN: 02-05-1981 (39 y.o. [name] Primary Care Provider: [place] Billey Gosling RD Other Clinician: Referring Provider: Treating Provider/Extender: Worthy Keeler [place] Billey Gosling RD Weeks in Treatment: 1 Active Problems ICD-10 Encounter Code Description Active Date MDM Diagnosis I87.2 Venous insufficiency (chronic) (peripheral) 05/09/2020 No Yes S81.802A Unspecified open wound, left lower leg, initial encounter 05/09/2020 No Yes L97.822 Non-pressure chronic ulcer of other part of left lower leg with fat layer exposed6/01/2020 No Yes I10 Essential (primary) hypertension 05/09/2020 No Yes Inactive Problems Resolved Problems Electronic Signature(s) Signed: 05/16/2020 3:53:39 PM By: Worthy Keeler PA-C Entered By: Worthy Keeler on 05/16/2020 15:53:39 -------------------------------------------------------------------------------- Progress Note Details Patient Name: Date of Service: [name] 05/16/2020 3:15 PM Medical Record Number: [id number] Patient Account Number: [id number] Date of Birth/Sex: Treating RN: 1981/11/24 (39 y.o. [name] Primary Care Provider: MO Billey Gosling RD Other Clinician: Referring Provider: Treating Provider/Extender: Worthy Keeler MO Billey Gosling  RD Weeks in Treatment: 1 Subjective Chief Complaint Information obtained from Patient Left leg ulcer History of Present Illness (HPI) 05/09/2020 upon evaluation today patient actually appears to be doing somewhat poorly in regard to her left lower extremity anteriorly. She states that she had an injury where she fell down her basement stairs on November 2020. Subsequently she developed initially bruising and then a hematoma underneath of the  necrosis of tissue over the surface of the hematoma. In the matter of months since that time she has gone to several providers and most recently her primary care provider on May 14 who did do a culture initially she was placed on Bactrim that was subsequently switched to Cipro based on the Pseudomonas culture. Nonetheless the patient seems to be doing much better and states the pain has been much better since that time. Fortunately there is no evidence of active infection which is great news systemically or locally. There does not appear to be signs of overall worsening in my opinion based on what we see. She does have hypertension and does appear to have some lower extremity edema as well but otherwise I do not see any evidence that she has major medical problems contributing to this other than just the fact that she had underlying hematoma. 05/16/2020 upon evaluation today patient appears to be doing well visually except for the wound is slightly larger than what it was last week. She unfortunately had a lot of drainage including some odor as well that has her somewhat concerned. Fortunately there is no signs of active infection at this time. No fever chills noted. Infected actually appears to be less erythematous than last week nonetheless I still am not opposed to putting her on an antibiotic to try to help take care of this. The overall size of the wound is actually smaller as compared to previous which is good news. Objective Constitutional Well-nourished  and well-hydrated in no acute distress. Vitals Time Taken: 4:00 PM, Height: 69 in, Weight: 283 lbs, BMI: 41.8, Temperature: 98.3 F, Pulse: 77 bpm, Respiratory Rate: 18 breaths/min, Blood Pressure: 167/117 mmHg. Respiratory normal breathing without difficulty. Psychiatric this patient is able to make decisions and demonstrates good insight into disease process. Alert and Oriented x 3. pleasant and cooperative. General Notes: Her wound currently actually showed signs of good granulation progress although using the island dressing over the past couple of days probably made this look not quite as good as the picture she shows me nonetheless overall of her leg there is some improvement here. Overall the measurements are better. Integumentary (Hair, Skin) Wound #1 status is Open. Original cause of wound was Trauma. The wound is located on the Left,Anterior Lower Leg. The wound measures 2.1cm length x 1.9cm width x 0.1cm depth; 3.134cm^2 area and 0.313cm^3 volume. There is Fat Layer (Subcutaneous Tissue) Exposed exposed. There is no tunneling noted. There is a medium amount of serosanguineous drainage noted. The wound margin is distinct with the outline attached to the wound base. There is large (67- 100%) red granulation within the wound bed. There is no necrotic tissue within the wound bed. Assessment Active Problems ICD-10 Venous insufficiency (chronic) (peripheral) Unspecified open wound, left lower leg, initial encounter Non-pressure chronic ulcer of other part of left lower leg with fat layer exposed Essential (primary) hypertension Procedures Wound #1 Pre-procedure diagnosis of Wound #1 is a Venous Leg Ulcer located on the Left,Anterior Lower Leg . There was a Three Layer Compression Therapy Procedure by Carlene Coria, RN. Post procedure Diagnosis Wound #1: Same as Pre-Procedure Plan Follow-up Appointments: Return Appointment in 1 week. Dressing Change Frequency: Do not change entire  dressing for one week. Skin Barriers/Peri-Wound Care: Wound #1 Left,Anterior Lower Leg: Moisturizing lotion - to leg Wound Cleansing: May shower with protection. Primary Wound Dressing: Wound #1 Left,Anterior Lower Leg: Hydrofera Blue - classic moistened with saline Secondary Dressing: Wound #1 Left,Anterior Lower Leg: Dry Gauze  Edema Control: 3 Layer Compression System - Left Lower Extremity Avoid standing for long periods of time Elevate legs to the level of the heart or above for 30 minutes daily and/or when sitting, a frequency of: - throughout the day Exercise regularly Laboratory ordered were: Anerobic culture - left lower leg The following medication(s) was prescribed: Levaquin oral 750 mg tablet 1 1 tablet oral taken 1 time per day for 14 days starting 05/16/2020 1. I would recommend currently that we go ahead and initiate treatment with a course of Levaquin which I think is good to be the best way to go. 2. I am also can recommend currently that we continue with a 3 layer compression wrap. 3. I would also suggest she continue to elevate her legs much as possible. We will see patient back for reevaluation in 1 week here in the clinic. If anything worsens or changes patient will contact our office for additional recommendations. Electronic Signature(s) Signed: 05/16/2020 5:01:01 PM By: Worthy Keeler PA-C Previous Signature: 05/16/2020 4:59:16 PM Version By: Worthy Keeler PA-C Entered By: Worthy Keeler on 05/16/2020 17:01:01 -------------------------------------------------------------------------------- SuperBill Details Patient Name: Date of Service: [name] 05/16/2020 Medical Record Number: [id number] Patient Account Number: [id number] Date of Birth/Sex: Treating RN: [birth year] (39 y.o. [name] Primary Care Provider: [place] Billey Gosling RD Other Clinician: Referring Provider: Treating Provider/Extender: Worthy Keeler [place] Billey Gosling RD Weeks in  Treatment: 1 Diagnosis Coding ICD-10 Codes Code Description I87.2 Venous insufficiency (chronic) (peripheral) S81.802A Unspecified open wound, left lower leg, initial encounter L97.822 Non-pressure chronic ulcer of other part of left lower leg with fat layer exposed [place] (primary) hypertension Facility Procedures CPT4 Code: [id number] Description: (Facility Use Only) [phone] - [place] LWR LT LEG Modifier: Quantity: 1 Physician Procedures : CPT4 Code Description Modifier [id number] - WC PHYS LEVEL 4 - EST PT ICD-10 Diagnosis Description I87.2 Venous insufficiency (chronic) (peripheral) S81.802A Unspecified open wound, left lower leg, initial encounter L97.822 Non-pressure chronic ulcer  of other part of left lower leg with fat layer exposed [place] (primary) hypertension Quantity: 1 Electronic Signature(s) Signed: 05/16/2020 5:01:11 PM By: Worthy Keeler PA-C Entered By: Worthy Keeler on 05/16/2020 17:01:11

## 2020-05-17 ENCOUNTER — Other Ambulatory Visit (HOSPITAL_COMMUNITY)
Admission: RE | Admit: 2020-05-17 | Discharge: 2020-05-17 | Disposition: A | Discharge status: Home / Self Care | Payer: 59 | Source: Other Acute Inpatient Hospital | Attending: Physician Assistant | Admitting: Physician Assistant

## 2020-05-17 DIAGNOSIS — B999 Unspecified infectious disease: Secondary | ICD-10-CM | POA: Diagnosis not present

## 2020-05-17 NOTE — Progress Notes (Signed)
Alexandra Petty, Alexandra Petty ([id number]) Visit Report for 05/16/2020 Arrival Information Details Patient Name: Date of Service: Alexandra Petty [place] NDA 05/16/2020 3:15 PM Medical Record Number: [id number] Patient Account Number: [id number] Date of Birth/Sex: Treating RN: 27-Jul-1981 (39 y.o. Alexandra Petty Primary Care Naftoli Penny: [place] Billey Gosling RD Other Clinician: Referring Lamanda Rudder: Treating Chrysa Rampy/Extender: Worthy Keeler [place] RRO Kristopher Oppenheim RD Weeks in Treatment: 1 Visit Information History Since Last Visit Added or deleted any medications: No Patient Arrived: Ambulatory Any new allergies or adverse reactions: No Arrival Time: 15:59 Had a fall or experienced change in No Accompanied By: alone activities of daily living that may affect Transfer Assistance: None risk of falls: Patient Identification Verified: Yes Signs or symptoms of abuse/neglect since last visito No Secondary Verification Process Completed: Yes Hospitalized since last visit: No Patient Has Alerts: Yes Implantable device outside of the clinic excluding No Patient Alerts: Left ABI: 0.99 cellular tissue based products placed in the center since last visit: Has Dressing in Place as Prescribed: No Has Compression in Place as Prescribed: No Pain Present Now: No Electronic Signature(s) Signed: 05/17/2020 5:18:16 PM By: Levan Hurst RN, BSN Entered By: Levan Hurst on 05/16/2020 15:59:29 -------------------------------------------------------------------------------- Compression Therapy Details Patient Name: Date of Service: Alexandra Kern MA NDA 05/16/2020 3:15 PM Medical Record Number: [id number] Patient Account Number: [id number] Date of Birth/Sex: Treating RN: February 12, 1981 (13 y.o. Alexandra Petty Primary Care Martrice Apt: [place] Billey Gosling RD Other Clinician: Referring Ketara Cavness: Treating Abbigail Anstey/Extender: Worthy Keeler [place] Billey Gosling RD Weeks in Treatment: 1 Compression Therapy Performed for Wound Assessment: Wound #1  Left,Anterior Lower Leg Performed By: Clinician Carlene Coria, RN Compression Type: Three Layer Post Procedure Diagnosis Same as Pre-procedure Electronic Signature(s) Signed: 05/16/2020 6:08:32 PM By: Baruch Gouty RN, BSN Entered By: Baruch Gouty on 05/16/2020 16:50:46 -------------------------------------------------------------------------------- Encounter Discharge Information Details Patient Name: Date of Service: Alexandra Kern MA NDA 05/16/2020 3:15 PM Medical Record Number: [id number] Patient Account Number: [id number] Date of Birth/Sex: Treating RN: [birth year] (6 y.o. Alexandra Petty Primary Care Barbara Ahart: [place] Billey Gosling RD Other Clinician: Referring Antha Niday: Treating Wilian Kwong/Extender: Albertine Grates RD Weeks in Treatment: 1 Encounter Discharge Information Items Discharge Condition: Stable Ambulatory Status: Ambulatory Discharge Destination: Home Transportation: Private Auto Accompanied By: alone Schedule Follow-up Appointment: Yes Clinical Summary of Care: Patient Declined Electronic Signature(s) Signed: 05/17/2020 5:18:16 PM By: Levan Hurst RN, BSN Entered By: Levan Hurst on 05/16/2020 17:18:17 -------------------------------------------------------------------------------- Lower Extremity Assessment Details Patient Name: Date of Service: Alexandra Kern MA NDA 05/16/2020 3:15 PM Medical Record Number: [id number] Patient Account Number: [id number] Date of Birth/Sex: Treating RN: 01-29-1981 (40 y.o. Alexandra Petty Primary Care Jermie Hippe: [place] Billey Gosling RD Other Clinician: Referring Weldon Nouri: Treating Xavier Fournier/Extender: Worthy Keeler [place] RRO Kristopher Oppenheim RD Weeks in Treatment: 1 Edema Assessment Assessed: [Left: No] [Right: No] Edema: [Left: Ye] [Right: s] Calf Left: Right: Point of Measurement: 30 cm From Medial Instep 46 cm cm Ankle Left: Right: Point of Measurement: 12 cm From Medial Instep 24 cm cm Vascular  Assessment Pulses: Dorsalis Pedis Palpable: [Left:Yes] Electronic Signature(s) Signed: 05/17/2020 5:18:16 PM By: Levan Hurst RN, BSN Entered By: Levan Hurst on 05/16/2020 16:02:13 -------------------------------------------------------------------------------- [place] Details Patient Name: Date of Service: Alexandra Kern MA NDA 05/16/2020 3:15 PM Medical Record Number: [id number] Patient Account Number: [id number] Date of Birth/Sex: Treating RN: 1981/03/28 (44 y.o. Alexandra Petty Primary Care Medea Deines: [place] Billey Gosling RD Other Clinician: Referring Bridget Westbrooks: Treating Caidon Foti/Extender: Worthy Keeler  [place] RRO W, RICHA RD Weeks in Treatment: 1 Active Inactive Necrotic Tissue Nursing Diagnoses: Impaired tissue integrity related to necrotic/devitalized tissue Knowledge deficit related to management of necrotic/devitalized tissue Goals: Necrotic/devitalized tissue will be minimized in the wound bed Date Initiated: 05/09/2020 Target Resolution Date: 06/06/2020 Goal Status: Active Patient/caregiver will verbalize understanding of reason and process for debridement of necrotic tissue Date Initiated: 05/09/2020 Target Resolution Date: 06/06/2020 Goal Status: Active Interventions: Assess patient pain level pre-, during and post procedure and prior to discharge Provide education on necrotic tissue and debridement process Treatment Activities: Apply topical anesthetic as ordered : 05/09/2020 Excisional debridement : 05/09/2020 Notes: Wound/Skin Impairment Nursing Diagnoses: Impaired tissue integrity Knowledge deficit related to ulceration/compromised skin integrity Goals: Patient/caregiver will verbalize understanding of skin care regimen Date Initiated: 05/09/2020 Target Resolution Date: 06/06/2020 Goal Status: Active Ulcer/skin breakdown will have a volume reduction of 30% by week 4 Date Initiated: 05/09/2020 Target Resolution Date: 06/06/2020 Goal Status:  Active Interventions: Assess patient/caregiver ability to obtain necessary supplies Assess patient/caregiver ability to perform ulcer/skin care regimen upon admission and as needed Assess ulceration(s) every visit Provide education on ulcer and skin care Treatment Activities: Skin care regimen initiated : 05/09/2020 Topical wound management initiated : 05/09/2020 Notes: Electronic Signature(s) Signed: 05/16/2020 6:08:32 PM By: Baruch Gouty RN, BSN Entered By: Baruch Gouty on 05/16/2020 15:56:53 -------------------------------------------------------------------------------- Pain Assessment Details Patient Name: Date of Service: Alexandra Kern MA NDA 05/16/2020 3:15 PM Medical Record Number: [id number] Patient Account Number: [id number] Date of Birth/Sex: Treating RN: 1981-06-04 (18 y.o. Alexandra Petty Primary Care Excell Neyland: [place] Billey Gosling RD Other Clinician: Referring Javia Dillow: Treating Elzy Tomasello/Extender: Worthy Keeler [place] Billey Gosling RD Weeks in Treatment: 1 Active Problems Location of Pain Severity and Description of Pain Patient Has Paino No Site Locations Pain Management and Medication Current Pain Management: Electronic Signature(s) Signed: 05/17/2020 5:18:16 PM By: Levan Hurst RN, BSN Entered By: Levan Hurst on 05/16/2020 16:02:01 -------------------------------------------------------------------------------- Patient/Caregiver Education Details Patient Name: Date of Service: Alexandra Kern MA NDA 6/9/2021andnbsp3:15 PM Medical Record Number: [id number] Patient Account Number: [id number] Date of Birth/Gender: Treating RN: 1981-03-10 (14 y.o. Alexandra Petty Primary Care Physician: [place], Teton RD Other Clinician: Referring Physician: Treating Physician/Extender: Albertine Grates RD Weeks in Treatment: 1 Education Assessment Education Provided To: Patient Education Topics Provided Venous: Methods: Explain/Verbal Responses:  Reinforcements needed, State content correctly Wound/Skin Impairment: Methods: Explain/Verbal Responses: Reinforcements needed, State content correctly Electronic Signature(s) Signed: 05/16/2020 6:08:32 PM By: Baruch Gouty RN, BSN Entered By: Baruch Gouty on 05/16/2020 15:57:16 -------------------------------------------------------------------------------- Wound Assessment Details Patient Name: Date of Service: Alexandra Kern MA NDA 05/16/2020 3:15 PM Medical Record Number: [id number] Patient Account Number: [id number] Date of Birth/Sex: Treating RN: May 25, 1981 (28 y.o. Alexandra Petty Primary Care Khilee Hendricksen: [place] Billey Gosling RD Other Clinician: Referring Keanen Dohse: Treating Jerl Munyan/Extender: Worthy Keeler [place] RRO Kristopher Oppenheim RD Weeks in Treatment: 1 Wound Status Wound Number: 1 Primary Etiology: Venous Leg Ulcer Wound Location: Left, Anterior Lower Leg Secondary Etiology: Trauma, Other Wounding Event: Trauma Wound Status: Open Date Acquired: 10/09/2019 Comorbid History: Hypertension Weeks Of Treatment: 1 Clustered Wound: No Wound Measurements Length: (cm) 2.1 Width: (cm) 1.9 Depth: (cm) 0.1 Area: (cm) 3.134 Volume: (cm) 0.313 % Reduction in Area: -23.5% % Reduction in Volume: 38.3% Epithelialization: None Tunneling: No Wound Description Classification: Full Thickness Without Exposed Support Structures Wound Margin: Distinct, outline attached Exudate Amount: Medium Exudate Type: Serosanguineous Exudate Color: red, brown Foul Odor After Cleansing: No Slough/Fibrino  No Wound Bed Granulation Amount: Large (67-100%) Exposed Structure Granulation Quality: Red Fascia Exposed: No Necrotic Amount: None Present (0%) Fat Layer (Subcutaneous Tissue) Exposed: Yes Tendon Exposed: No Muscle Exposed: No Joint Exposed: No Bone Exposed: No Treatment Notes Wound #1 (Left, Anterior Lower Leg) 1. Cleanse With Wound Cleanser 2. Periwound Care Moisturizing lotion 3. Primary  Dressing Applied Hydrofera Blue 4. Secondary Dressing Dry Gauze 6. Support Layer Applied 3 layer compression wrap Notes netting Electronic Signature(s) Signed: 05/17/2020 5:18:16 PM By: Levan Hurst RN, BSN Entered By: Levan Hurst on 05/16/2020 16:03:46 -------------------------------------------------------------------------------- [place] Details Patient Name: Date of Service: Alexandra Kern MA NDA 05/16/2020 3:15 PM Medical Record Number: [id number] Patient Account Number: [id number] Date of Birth/Sex: Treating RN: 05-02-1981 (25 y.o. Alexandra Petty Primary Care Riyanshi Wahab: [place] Billey Gosling RD Other Clinician: Referring Jadesola Poynter: Treating Tieler Cournoyer/Extender: Worthy Keeler [place] RRO Kristopher Oppenheim RD Weeks in Treatment: 1 Vital Signs Time Taken: 16:00 Temperature (F): 98.3 Height (in): 69 Pulse (bpm): 77 Weight (lbs): 283 Respiratory Rate (breaths/min): 18 Body Mass Index (BMI): 41.8 Blood Pressure (mmHg): 167/117 Reference Range: 80 - 120 mg / dl Electronic Signature(s) Signed: 05/17/2020 5:18:16 PM By: Levan Hurst RN, BSN Entered By: Levan Hurst on 05/16/2020 16:01:56

## 2020-05-19 LAB — AEROBIC CULTURE W GRAM STAIN (SUPERFICIAL SPECIMEN)

## 2020-05-23 ENCOUNTER — Encounter (HOSPITAL_BASED_OUTPATIENT_CLINIC_OR_DEPARTMENT_OTHER): Payer: 59 | Admitting: Physician Assistant

## 2020-05-23 ENCOUNTER — Other Ambulatory Visit: Payer: Self-pay

## 2020-05-23 DIAGNOSIS — I872 Venous insufficiency (chronic) (peripheral): Secondary | ICD-10-CM | POA: Diagnosis not present

## 2020-05-23 DIAGNOSIS — L97822 Non-pressure chronic ulcer of other part of left lower leg with fat layer exposed: Secondary | ICD-10-CM | POA: Diagnosis not present

## 2020-05-23 DIAGNOSIS — I10 Essential (primary) hypertension: Secondary | ICD-10-CM | POA: Diagnosis not present

## 2020-05-23 DIAGNOSIS — Z809 Family history of malignant neoplasm, unspecified: Secondary | ICD-10-CM | POA: Diagnosis not present

## 2020-05-23 DIAGNOSIS — Z8249 Family history of ischemic heart disease and other diseases of the circulatory system: Secondary | ICD-10-CM | POA: Diagnosis not present

## 2020-05-23 NOTE — Progress Notes (Addendum)
Alexandra Petty, Alexandra Petty ([id number]) Visit Report for 05/23/2020 Chief Complaint Document Details Patient Name: Date of Service: Alexandra Petty, Alexandra Petty [place] NDA 05/23/2020 7:30 A M Medical Record Number: [id number] Patient Account Number: [id number] Date of Birth/Sex: Treating RN: [birth year] (39 y.o. Alexandra Petty Primary Care Provider: MO Billey Gosling RD Other Clinician: Referring Provider: Treating Provider/Extender: Albertine Grates RD Weeks in Treatment: 2 Information Obtained from: Patient Chief Complaint Left leg ulcer Electronic Signature(s) Signed: 05/23/2020 8:17:11 AM By: Worthy Keeler PA-C Entered By: Worthy Keeler on 05/23/2020 08:17:11 -------------------------------------------------------------------------------- HPI Details Patient Name: Date of Service: Alexandra Petty NDA 05/23/2020 7:30 A M Medical Record Number: [id number] Patient Account Number: [id number] Date of Birth/Sex: Treating RN: [birth year] (72 y.o. Alexandra Petty Primary Care Provider: MO Billey Gosling RD Other Clinician: Referring Provider: Treating Provider/Extender: Worthy Keeler [place] Billey Gosling RD Weeks in Treatment: 2 History of Present Illness HPI Description: 05/09/2020 upon evaluation today patient actually appears to be doing somewhat poorly in regard to her left lower extremity anteriorly. She states that she had an injury where she fell down her basement stairs on November 2020. Subsequently she developed initially bruising and then a hematoma underneath of the necrosis of tissue over the surface of the hematoma. In the matter of months since that time she has gone to several providers and most recently her primary care provider on May 14 who did do a culture initially she was placed on Bactrim that was subsequently switched to Cipro based on the Pseudomonas culture. Nonetheless the patient seems to be doing much better and states the pain has been much better since that time. Fortunately there  is no evidence of active infection which is great news systemically or locally. There does not appear to be signs of overall worsening in my opinion based on what we see. She does have hypertension and does appear to have some lower extremity edema as well but otherwise I do not see any evidence that she has major medical problems contributing to this other than just the fact that she had underlying hematoma. 05/16/2020 upon evaluation today patient appears to be doing well visually except for the wound is slightly larger than what it was last week. She unfortunately had a lot of drainage including some odor as well that has her somewhat concerned. Fortunately there is no signs of active infection at this time. No fever chills noted. Infected actually appears to be less erythematous than last week nonetheless I still am not opposed to putting her on an antibiotic to try to help take care of this. The overall size of the wound is actually smaller as compared to previous which is good news. 05/23/2020 upon evaluation today patient appears to be doing excellent in regard to her wound. She has been tolerating the dressing changes without complication. The good news is her antibiotic seems to be doing a great job of treating the Pseudomonas infection which was noted that she had. Fortunately there is no evidence of active infection systemically and even locally this appears to be doing much better. In general I think she is on the right course here. Electronic Signature(s) Signed: 05/23/2020 8:30:27 AM By: Worthy Keeler PA-C Entered By: Worthy Keeler on 05/23/2020 08:30:27 -------------------------------------------------------------------------------- Chemical Cauterization Details Patient Name: Date of Service: Alexandra Petty NDA 05/23/2020 7:30 A M Medical Record Number: [id number] Patient Account Number: [id number] Date of Birth/Sex: Treating RN: 11-06-1981 (39 y.o. F) Baruch Gouty  Primary Care  Provider: MO Billey Gosling RD Other Clinician: Referring Provider: Treating Provider/Extender: Albertine Grates RD Weeks in Treatment: 2 Procedure Performed for: Wound #1 Left,Anterior Lower Leg Performed By: Physician Worthy Keeler, PA Post Procedure Diagnosis Same as Pre-procedure Notes using silver nitrate stick Electronic Signature(s) Signed: 05/24/2020 1:39:37 PM By: Worthy Keeler PA-C Signed: 05/24/2020 5:22:52 PM By: Baruch Gouty RN, BSN Entered By: Baruch Gouty on 05/23/2020 62:03:55 -------------------------------------------------------------------------------- Physical Exam Details Patient Name: Date of Service: Alexandra Petty NDA 05/23/2020 7:30 A M Medical Record Number: [id number] Patient Account Number: [id number] Date of Birth/Sex: Treating RN: [birth year] (59 y.o. Alexandra Petty Primary Care Provider: MO Billey Gosling RD Other Clinician: Referring Provider: Treating Provider/Extender: Worthy Keeler MO RRO Kristopher Oppenheim RD Weeks in Treatment: 2 Constitutional Well-nourished and well-hydrated in no acute distress. Respiratory normal breathing without difficulty. Psychiatric this patient is able to make decisions and demonstrates good insight into disease process. Alert and Oriented x 3. pleasant and cooperative. Notes Patient's wound bed currently showed signs of some hyper granular tissue but a lot of new skin and healing. I do think chemical cauterization with silver nitrate would be of benefit for her to help with controlling some of the hyper granulation and allowing this to heal more effectively. The patient is in agreement with that plan. Electronic Signature(s) Signed: 05/23/2020 8:30:48 AM By: Worthy Keeler PA-C Entered By: Worthy Keeler on 05/23/2020 08:30:48 -------------------------------------------------------------------------------- Physician Orders Details Patient Name: Date of Service: Alexandra Petty NDA 05/23/2020 7:30 A  M Medical Record Number: [id number] Patient Account Number: [id number] Date of Birth/Sex: Treating RN: 11/03/1981 (23 y.o. Alexandra Petty Primary Care Provider: MO Billey Gosling RD Other Clinician: Referring Provider: Treating Provider/Extender: Worthy Keeler MO Billey Gosling RD Weeks in Treatment: 2 Verbal / Phone Orders: No Diagnosis Coding ICD-10 Coding Code Description I87.2 Venous insufficiency (chronic) (peripheral) S81.802A Unspecified open wound, left lower leg, initial encounter L97.822 Non-pressure chronic ulcer of other part of left lower leg with fat layer exposed [place] (primary) hypertension Follow-up Appointments Return Appointment in 1 week. Dressing Change Frequency Do not change entire dressing for one week. Skin Barriers/Peri-Wound Care Wound #1 Left,Anterior Lower Leg Moisturizing lotion - to leg Wound Cleansing May shower with protection. Primary Wound Dressing Wound #1 Left,Anterior Lower Leg Hydrofera Blue - classic moistened with saline Secondary Dressing Wound #1 Left,Anterior Lower Leg Dry Gauze Edema Control 3 Layer Compression System - Left Lower Extremity Avoid standing for long periods of time Elevate legs to the level of the heart or above for 30 minutes daily and/or when sitting, a frequency of: - throughout the day Exercise regularly Electronic Signature(s) Signed: 05/24/2020 1:39:37 PM By: Worthy Keeler PA-C Signed: 05/24/2020 5:22:52 PM By: Baruch Gouty RN, BSN Entered By: Baruch Gouty on 05/23/2020 08:21:55 -------------------------------------------------------------------------------- Problem List Details Patient Name: Date of Service: Alexandra Petty NDA 05/23/2020 7:30 A M Medical Record Number: [id number] Patient Account Number: [id number] Date of Birth/Sex: Treating RN: 1981/01/31 (62 y.o. Alexandra Petty Primary Care Provider: MO Billey Gosling RD Other Clinician: Referring Provider: Treating  Provider/Extender: Worthy Keeler [place] Billey Gosling RD Weeks in Treatment: 2 Active Problems ICD-10 Encounter Encounter Code Description Active Date MDM Diagnosis I87.2 Venous insufficiency (chronic) (peripheral) 05/09/2020 No Yes S81.802A Unspecified open wound, left lower leg, initial encounter 05/09/2020 No Yes L97.822 Non-pressure chronic ulcer of other part of left lower leg with fat  layer exposed6/01/2020 No Yes I10 Essential (primary) hypertension 05/09/2020 No Yes Inactive Problems Resolved Problems Electronic Signature(s) Signed: 05/23/2020 8:17:05 AM By: Worthy Keeler PA-C Entered By: Worthy Keeler on 05/23/2020 08:17:05 -------------------------------------------------------------------------------- Progress Note Details Patient Name: Date of Service: Alexandra Petty NDA 05/23/2020 7:30 A M Medical Record Number: [id number] Patient Account Number: [id number] Date of Birth/Sex: Treating RN: 1981/11/07 (40 y.o. Alexandra Petty Primary Care Provider: [place] Billey Gosling RD Other Clinician: Referring Provider: Treating Provider/Extender: Worthy Keeler MO Billey Gosling RD Weeks in Treatment: 2 Subjective Chief Complaint Information obtained from Patient Left leg ulcer History of Present Illness (HPI) 05/09/2020 upon evaluation today patient actually appears to be doing somewhat poorly in regard to her left lower extremity anteriorly. She states that she had an injury where she fell down her basement stairs on November 2020. Subsequently she developed initially bruising and then a hematoma underneath of the necrosis of tissue over the surface of the hematoma. In the matter of months since that time she has gone to several providers and most recently her primary care provider on May 14 who did do a culture initially she was placed on Bactrim that was subsequently switched to Cipro based on the Pseudomonas culture. Nonetheless the patient seems to be doing much better and states the  pain has been much better since that time. Fortunately there is no evidence of active infection which is great news systemically or locally. There does not appear to be signs of overall worsening in my opinion based on what we see. She does have hypertension and does appear to have some lower extremity edema as well but otherwise I do not see any evidence that she has major medical problems contributing to this other than just the fact that she had underlying hematoma. 05/16/2020 upon evaluation today patient appears to be doing well visually except for the wound is slightly larger than what it was last week. She unfortunately had a lot of drainage including some odor as well that has her somewhat concerned. Fortunately there is no signs of active infection at this time. No fever chills noted. Infected actually appears to be less erythematous than last week nonetheless I still am not opposed to putting her on an antibiotic to try to help take care of this. The overall size of the wound is actually smaller as compared to previous which is good news. 05/23/2020 upon evaluation today patient appears to be doing excellent in regard to her wound. She has been tolerating the dressing changes without complication. The good news is her antibiotic seems to be doing a great job of treating the Pseudomonas infection which was noted that she had. Fortunately there is no evidence of active infection systemically and even locally this appears to be doing much better. In general I think she is on the right course here. Objective Constitutional Well-nourished and well-hydrated in no acute distress. Vitals Time Taken: 7:56 AM, Height: 69 in, Weight: 283 lbs, BMI: 41.8, Temperature: 98.3 F, Pulse: 84 bpm, Respiratory Rate: 18 breaths/min, Blood Pressure: 150/97 mmHg. Respiratory normal breathing without difficulty. Psychiatric this patient is able to make decisions and demonstrates good insight into disease process.  Alert and Oriented x 3. pleasant and cooperative. General Notes: Patient's wound bed currently showed signs of some hyper granular tissue but a lot of new skin and healing. I do think chemical cauterization with silver nitrate would be of benefit for her to help with controlling some of the hyper  granulation and allowing this to heal more effectively. The patient is in agreement with that plan. Integumentary (Hair, Skin) Wound #1 status is Open. Original cause of wound was Trauma. The wound is located on the Left,Anterior Lower Leg. The wound measures 1.9cm length x 1.5cm width x 0.1cm depth; 2.238cm^2 area and 0.224cm^3 volume. There is Fat Layer (Subcutaneous Tissue) Exposed exposed. There is no tunneling or undermining noted. There is a medium amount of serosanguineous drainage noted. The wound margin is distinct with the outline attached to the wound base. There is large (67-100%) red granulation within the wound bed. There is no necrotic tissue within the wound bed. Assessment Active Problems ICD-10 Venous insufficiency (chronic) (peripheral) Unspecified open wound, left lower leg, initial encounter Non-pressure chronic ulcer of other part of left lower leg with fat layer exposed [place] (primary) hypertension Procedures Wound #1 Pre-procedure diagnosis of Wound #1 is a Venous Leg Ulcer located on the Left,Anterior Lower Leg . There was a Three Layer Compression Therapy Procedure by Baruch Gouty, RN. Post procedure Diagnosis Wound #1: Same as Pre-Procedure Pre-procedure diagnosis of Wound #1 is a Venous Leg Ulcer located on the Left,Anterior Lower Leg . An Chemical Cauterization procedure was performed by Worthy Keeler, PA. Post procedure Diagnosis Wound #1: Same as Pre-Procedure Notes: using silver nitrate stick Plan Follow-up Appointments: Return Appointment in 1 week. Dressing Change Frequency: Do not change entire dressing for one week. Skin Barriers/Peri-Wound  Care: Wound #1 Left,Anterior Lower Leg: Moisturizing lotion - to leg Wound Cleansing: May shower with protection. Primary Wound Dressing: Wound #1 Left,Anterior Lower Leg: Hydrofera Blue - classic moistened with saline Secondary Dressing: Wound #1 Left,Anterior Lower Leg: Dry Gauze Edema Control: 3 Layer Compression System - Left Lower Extremity Avoid standing for long periods of time Elevate legs to the level of the heart or above for 30 minutes daily and/or when sitting, a frequency of: - throughout the day Exercise regularly 1 I would recommend currently that we going to continue with the Hydrofera Blue dressing I think that still a good option for her here. 2. We will also continue with the chemical cauterization with silver nitrate I think that is also beneficial. 3. I would also recommend continuing with the 3 layer compression wrap which seems to be of utmost benefit. 4. We also recommended compression stockings and a 20-30 mmHg range. We will see patient back for reevaluation in 1 week here in the clinic. If anything worsens or changes patient will contact our office for additional recommendations. Electronic Signature(s) Signed: 05/23/2020 8:31:22 AM By: Worthy Keeler PA-C Entered By: Worthy Keeler on 05/23/2020 08:31:22 -------------------------------------------------------------------------------- SuperBill Details Patient Name: Date of Service: Alexandra Petty NDA 05/23/2020 Medical Record Number: [id number] Patient Account Number: [id number] Date of Birth/Sex: Treating RN: [birth year] (66 y.o. Alexandra Petty Primary Care Provider: [place] Billey Gosling RD Other Clinician: Referring Provider: Treating Provider/Extender: Worthy Keeler [place] Billey Gosling RD Weeks in Treatment: 2 Diagnosis Coding ICD-10 Codes Code Description I87.2 Venous insufficiency (chronic) (peripheral) S81.802A Unspecified open wound, left lower leg, initial encounter L97.822 Non-pressure chronic  ulcer of other part of left lower leg with fat layer exposed [place] (primary) hypertension Facility Procedures CPT4 Code: 19509326 Description: (Facility Use Only) [id number] - [facility] LWR LT LEG Modifier: 59 Quantity: 1 CPT4 Code: 71245809 Description: 98338 - CHEM CAUT GRANULATION TISS ICD-10 Diagnosis Description L97.822 Non-pressure chronic ulcer of other part of left lower leg with fat layer exposed Modifier: Quantity: 1  Physician Procedures : CPT4 Code Description Modifier [id number] - WC PHYS CHEM CAUT GRAN TISSUE ICD-10 Diagnosis Description L97.822 Non-pressure chronic ulcer of other part of left lower leg with fat layer exposed Quantity: 1 Electronic Signature(s) Signed: 05/23/2020 8:31:28 AM By: Worthy Keeler PA-C Entered By: Worthy Keeler on 05/23/2020 08:31:27

## 2020-05-24 NOTE — Progress Notes (Signed)
Alexandra Petty, FOSDICK ([id number]) Visit Report for 05/23/2020 Arrival Information Details Patient Name: Date of Service: Alexandra Petty [place] NDA 05/23/2020 7:30 A M Medical Record Number: [id number] Patient Account Number: [id number] Date of Birth/Sex: Treating RN: 1981/05/06 (39 y.o. Alexandra Petty Primary Care Craig Wisnewski: [place] Billey Gosling RD Other Clinician: Referring Avarey Yaeger: Treating Fae Blossom/Extender: Worthy Keeler [place] RRO Kristopher Oppenheim RD Weeks in Treatment: 2 Visit Information History Since Last Visit Added or deleted any medications: No Patient Arrived: Ambulatory Any new allergies or adverse reactions: No Arrival Time: 07:44 Had a fall or experienced change in No Accompanied By: self activities of daily living that may affect Transfer Assistance: None risk of falls: Patient Identification Verified: Yes Signs or symptoms of abuse/neglect since last visito No Secondary Verification Process Completed: Yes Hospitalized since last visit: No Patient Has Alerts: Yes Implantable device outside of the clinic excluding No Patient Alerts: Left ABI: 0.99 cellular tissue based products placed in the center since last visit: Has Dressing in Place as Prescribed: Yes Pain Present Now: No Electronic Signature(s) Signed: 05/24/2020 12:45:44 PM By: Sandre Kitty Entered By: Sandre Kitty on 05/23/2020 07:44:50 -------------------------------------------------------------------------------- Compression Therapy Details Patient Name: Date of Service: Alexandra Kern MA NDA 05/23/2020 7:30 A M Medical Record Number: [id number] Patient Account Number: [id number] Date of Birth/Sex: Treating RN: 1981/03/30 (73 y.o. Alexandra Petty Primary Care Ytzel Gubler: [place] Billey Gosling RD Other Clinician: Referring Jaylinn Hellenbrand: Treating Andraya Frigon/Extender: Albertine Grates RD Weeks in Treatment: 2 Compression Therapy Performed for Wound Assessment: Wound #1 Left,Anterior Lower Leg Performed By: Clinician  Baruch Gouty, RN Compression Type: Three Layer Post Procedure Diagnosis Same as Pre-procedure Electronic Signature(s) Signed: 05/24/2020 5:22:52 PM By: Baruch Gouty RN, BSN Entered By: Baruch Gouty on 05/23/2020 08:20:57 -------------------------------------------------------------------------------- Encounter Discharge Information Details Patient Name: Date of Service: Alexandra Kern MA NDA 05/23/2020 7:30 A M Medical Record Number: [id number] Patient Account Number: [id number] Date of Birth/Sex: Treating RN: 23-Nov-1981 (36 y.o. Alexandra Petty Primary Care Delmi Fulfer: [place] Billey Gosling RD Other Clinician: Referring Eliyas Suddreth: Treating Loomis Anacker/Extender: Albertine Grates RD Weeks in Treatment: 2 Encounter Discharge Information Items Discharge Condition: Stable Ambulatory Status: Ambulatory Discharge Destination: Home Transportation: Private Auto Accompanied By: self Schedule Follow-up Appointment: Yes Clinical Summary of Care: Patient Declined Electronic Signature(s) Signed: 05/24/2020 5:22:52 PM By: Baruch Gouty RN, BSN Entered By: Baruch Gouty on 05/23/2020 08:34:00 -------------------------------------------------------------------------------- Lower Extremity Assessment Details Patient Name: Date of Service: Alexandra Kern MA NDA 05/23/2020 7:30 A M Medical Record Number: [id number] Patient Account Number: [id number] Date of Birth/Sex: Treating RN: 1980/12/11 (39 y.o. Orvan Falconer Primary Care Krisy Dix: [place] Billey Gosling RD Other Clinician: Referring Cheyenne Schumm: Treating Lashonda Sonneborn/Extender: Worthy Keeler [place] RRO Kristopher Oppenheim RD Weeks in Treatment: 2 Edema Assessment Assessed: [Left: No] [Right: No] Edema: [Left: Ye] [Right: s] Calf Left: Right: Point of Measurement: 30 cm From Medial Instep 45 cm cm Ankle Left: Right: Point of Measurement: 12 cm From Medial Instep 22 cm cm Electronic Signature(s) Signed: 05/23/2020 9:54:21 AM By: Carlene Coria  RN Entered By: Carlene Coria on 05/23/2020 08:01:43 -------------------------------------------------------------------------------- Multi-Disciplinary Care Plan Details Patient Name: Date of Service: Alexandra Kern MA NDA 05/23/2020 7:30 A M Medical Record Number: [id number] Patient Account Number: [id number] Date of Birth/Sex: Treating RN: 1981/11/27 (40 y.o. Alexandra Petty Primary Care Sharnae Winfree: [place] Billey Gosling RD Other Clinician: Referring Mirela Parsley: Treating Aceyn Kathol/Extender: Worthy Keeler [place] Billey Gosling RD Weeks in Treatment: 2 Active Inactive Necrotic  Tissue Nursing Diagnoses: Impaired tissue integrity related to necrotic/devitalized tissue Knowledge deficit related to management of necrotic/devitalized tissue Goals: Necrotic/devitalized tissue will be minimized in the wound bed Date Initiated: 05/09/2020 Target Resolution Date: 06/06/2020 Goal Status: Active Patient/caregiver will verbalize understanding of reason and process for debridement of necrotic tissue Date Initiated: 05/09/2020 Target Resolution Date: 06/06/2020 Goal Status: Active Interventions: Assess patient pain level pre-, during and post procedure and prior to discharge Provide education on necrotic tissue and debridement process Treatment Activities: Apply topical anesthetic as ordered : 05/09/2020 Excisional debridement : 05/09/2020 Notes: Wound/Skin Impairment Nursing Diagnoses: Impaired tissue integrity Knowledge deficit related to ulceration/compromised skin integrity Goals: Patient/caregiver will verbalize understanding of skin care regimen Date Initiated: 05/09/2020 Target Resolution Date: 06/06/2020 Goal Status: Active Ulcer/skin breakdown will have a volume reduction of 30% by week 4 Date Initiated: 05/09/2020 Target Resolution Date: 06/06/2020 Goal Status: Active Interventions: Assess patient/caregiver ability to obtain necessary supplies Assess patient/caregiver ability to perform ulcer/skin care  regimen upon admission and as needed Assess ulceration(s) every visit Provide education on ulcer and skin care Treatment Activities: Skin care regimen initiated : 05/09/2020 Topical wound management initiated : 05/09/2020 Notes: Electronic Signature(s) Signed: 05/24/2020 5:22:52 PM By: Baruch Gouty RN, BSN Entered By: Baruch Gouty on 05/23/2020 08:20:17 -------------------------------------------------------------------------------- Pain Assessment Details Patient Name: Date of Service: Alexandra Kern MA NDA 05/23/2020 7:30 A M Medical Record Number: [id number] Patient Account Number: [id number] Date of Birth/Sex: Treating RN: [birth year] (59 y.o. Alexandra Petty Primary Care Yanni Ruberg: [place] Billey Gosling RD Other Clinician: Referring Faizan Geraci: Treating Carrieanne Kleen/Extender: Worthy Keeler [place] RRO Kristopher Oppenheim RD Weeks in Treatment: 2 Active Problems Location of Pain Severity and Description of Pain Patient Has Paino No Site Locations Pain Management and Medication Current Pain Management: Electronic Signature(s) Signed: 05/24/2020 12:45:44 PM By: Sandre Kitty Signed: 05/24/2020 5:22:52 PM By: Baruch Gouty RN, BSN Entered By: Sandre Kitty on 05/23/2020 07:56:57 -------------------------------------------------------------------------------- Patient/Caregiver Education Details Patient Name: Date of Service: Alexandra Kern MA NDA 6/16/2021andnbsp7:30 A M Medical Record Number: [id number] Patient Account Number: [id number] Date of Birth/Gender: Treating RN: 12-Apr-1981 (72 y.o. Alexandra Petty Primary Care Physician: [place], [place] RD Other Clinician: Referring Physician: Treating Physician/Extender: Albertine Grates RD Weeks in Treatment: 2 Education Assessment Education Provided To: Patient Education Topics Provided Venous: Methods: Explain/Verbal Responses: Reinforcements needed, State content correctly Wound/Skin Impairment: Methods:  Explain/Verbal Responses: Reinforcements needed, State content correctly Electronic Signature(s) Signed: 05/24/2020 5:22:52 PM By: Baruch Gouty RN, BSN Entered By: Baruch Gouty on 05/23/2020 08:20:37 -------------------------------------------------------------------------------- Wound Assessment Details Patient Name: Date of Service: Alexandra Kern MA NDA 05/23/2020 7:30 A M Medical Record Number: [id number] Patient Account Number: [id number] Date of Birth/Sex: Treating RN: [birth year] (62 y.o. Alexandra Petty Primary Care Samanthia Howland: [place] Billey Gosling RD Other Clinician: Referring Caedmon Louque: Treating Mccall Will/Extender: Worthy Keeler [place] RRO Kristopher Oppenheim RD Weeks in Treatment: 2 Wound Status Wound Number: 1 Primary Etiology: Venous Leg Ulcer Wound Location: Left, Anterior Lower Leg Secondary Etiology: Trauma, Other Wounding Event: Trauma Wound Status: Open Date Acquired: 10/09/2019 Comorbid History: Hypertension Weeks Of Treatment: 2 Clustered Wound: No Photos Photo Uploaded By: Mikeal Hawthorne on 05/23/2020 15:59:43 Wound Measurements Length: (cm) 1.9 Width: (cm) 1.5 Depth: (cm) 0.1 Area: (cm) 2.238 Volume: (cm) 0.224 % Reduction in Area: 11.8% % Reduction in Volume: 55.8% Epithelialization: None Tunneling: No Undermining: No Wound Description Classification: Full Thickness Without Exposed Support Structu Wound Margin: Distinct, outline attached Exudate Amount: Medium Exudate Type: Serosanguineous Exudate  Color: red, brown res Foul Odor After Cleansing: No Slough/Fibrino No Wound Bed Granulation Amount: Large (67-100%) Exposed Structure Granulation Quality: Red Fascia Exposed: No Necrotic Amount: None Present (0%) Fat Layer (Subcutaneous Tissue) Exposed: Yes Tendon Exposed: No Muscle Exposed: No Joint Exposed: No Bone Exposed: No Treatment Notes Wound #1 (Left, Anterior Lower Leg) 2. Periwound Care Moisturizing lotion 3. Primary Dressing Applied Hydrofera  Blue 4. Secondary Dressing Dry Gauze Other secondary dressing (specify in notes) 6. Support Layer Applied 3 layer compression wrap Notes carboflex Electronic Signature(s) Signed: 05/23/2020 9:54:21 AM By: Carlene Coria RN Signed: 05/24/2020 5:22:52 PM By: Baruch Gouty RN, BSN Entered By: Carlene Coria on 05/23/2020 08:02:05 -------------------------------------------------------------------------------- [place] Details Patient Name: Date of Service: Alexandra Kern MA NDA 05/23/2020 7:30 A M Medical Record Number: [id number] Patient Account Number: [id number] Date of Birth/Sex: Treating RN: 03/06/1981 (57 y.o. Alexandra Petty Primary Care Kyrie Fludd: [place] Billey Gosling RD Other Clinician: Referring Yizel Canby: Treating Arlethia Basso/Extender: Worthy Keeler [place] RRO Kristopher Oppenheim RD Weeks in Treatment: 2 Vital Signs Time Taken: 07:56 Temperature (F): 98.3 Height (in): 69 Pulse (bpm): 84 Weight (lbs): 283 Respiratory Rate (breaths/min): 18 Body Mass Index (BMI): 41.8 Blood Pressure (mmHg): 150/97 Reference Range: 80 - 120 mg / dl Electronic Signature(s) Signed: 05/24/2020 12:45:44 PM By: Sandre Kitty Entered By: Sandre Kitty on 05/23/2020 07:56:54

## 2020-05-28 ENCOUNTER — Other Ambulatory Visit: Payer: Self-pay | Admitting: Registered Nurse

## 2020-05-28 ENCOUNTER — Other Ambulatory Visit: Payer: Self-pay

## 2020-05-28 ENCOUNTER — Ambulatory Visit (INDEPENDENT_AMBULATORY_CARE_PROVIDER_SITE_OTHER): Payer: 59 | Admitting: Family Medicine

## 2020-05-28 ENCOUNTER — Encounter: Payer: Self-pay | Admitting: Family Medicine

## 2020-05-28 VITALS — BP 148/91 | HR 85 | Temp 97.7°F | Resp 18 | Ht 69.0 in | Wt 284.1 lb

## 2020-05-28 DIAGNOSIS — I10 Essential (primary) hypertension: Secondary | ICD-10-CM

## 2020-05-28 NOTE — Telephone Encounter (Signed)
05/28/2020 - PATIENT NEEDS TO RETURN FOR A BLOOD PRESSURE CHECK ONLY. HER PRIMARY CARE IS [place]. PATIENT WILL COME IN THIS AFTERNOON (05/28/2020) AT 4:20pm. TODAY WILL BE HER LAST PILL. (NIKKI KNOWS ABOUT THIS). [place]

## 2020-05-28 NOTE — Patient Instructions (Signed)
° ° ° °  If you have lab work done today you will be contacted with your lab results within the next 2 weeks.  If you have not heard from us then please contact us. The fastest way to get your results is to register for My Chart. ° ° °IF you received an x-ray today, you will receive an invoice from [facility]. Please contact [facility] at [phone] with questions or concerns regarding your invoice.  ° °IF you received labwork today, you will receive an invoice from [facility]. Please contact [facility] at [phone] with questions or concerns regarding your invoice.  ° °Our billing staff will not be able to assist you with questions regarding bills from these companies. ° °You will be contacted with the lab results as soon as they are available. The fastest way to get your results is to activate your My Chart account. Instructions are located on the last page of this paperwork. If you have not heard from us regarding the results in 2 weeks, please contact this office. °  ° ° ° °

## 2020-05-28 NOTE — Progress Notes (Signed)
Patient was seen for a blood pressure check 152/98 was the first reading then I went to the largest and got 148/91. Patient states that she sees a difference when using a bigger cuff.

## 2020-05-28 NOTE — Progress Notes (Signed)
Nurse visit only. Pt not seen.

## 2020-05-30 ENCOUNTER — Encounter (HOSPITAL_BASED_OUTPATIENT_CLINIC_OR_DEPARTMENT_OTHER): Payer: 59 | Admitting: Physician Assistant

## 2020-05-30 ENCOUNTER — Other Ambulatory Visit: Payer: Self-pay | Admitting: Registered Nurse

## 2020-05-30 DIAGNOSIS — Z8249 Family history of ischemic heart disease and other diseases of the circulatory system: Secondary | ICD-10-CM | POA: Diagnosis not present

## 2020-05-30 DIAGNOSIS — I10 Essential (primary) hypertension: Secondary | ICD-10-CM | POA: Diagnosis not present

## 2020-05-30 DIAGNOSIS — L97822 Non-pressure chronic ulcer of other part of left lower leg with fat layer exposed: Secondary | ICD-10-CM | POA: Diagnosis not present

## 2020-05-30 DIAGNOSIS — I872 Venous insufficiency (chronic) (peripheral): Secondary | ICD-10-CM | POA: Diagnosis not present

## 2020-05-30 DIAGNOSIS — Z809 Family history of malignant neoplasm, unspecified: Secondary | ICD-10-CM | POA: Diagnosis not present

## 2020-05-30 MED ORDER — HYDROCHLOROTHIAZIDE 25 MG PO TABS: 25.0000 mg | ORAL_TABLET | Freq: Every day | ORAL | 1 refills | Status: DC

## 2020-06-01 NOTE — Progress Notes (Signed)
Alexandra, Petty ([id number]) Visit Report for 05/30/2020 Arrival Information Details Patient Name: Date of Service: Alexandra Petty, Alexandra Petty [place] NDA 05/30/2020 7:30 A M Medical Record Number: [id number] Patient Account Number: [id number] Date of Birth/Sex: Treating RN: 1981/01/04 (39 y.o. Nancy Fetter Primary Care Shelbie Franken: [place] Billey Gosling RD Other Clinician: Referring Alpha Chouinard: Treating Telsa Dillavou/Extender: Worthy Keeler [place] RRO Kristopher Oppenheim RD Weeks in Treatment: 3 Visit Information History Since Last Visit Added or deleted any medications: No Patient Arrived: Ambulatory Any new allergies or adverse reactions: No Arrival Time: 07:54 Had a fall or experienced change in No Accompanied By: alone activities of daily living that may affect Transfer Assistance: None risk of falls: Patient Identification Verified: Yes Signs or symptoms of abuse/neglect since last visito No Secondary Verification Process Completed: Yes Hospitalized since last visit: No Patient Has Alerts: Yes Implantable device outside of the clinic excluding No Patient Alerts: Left ABI: 0.99 cellular tissue based products placed in the center since last visit: Has Dressing in Place as Prescribed: Yes Has Compression in Place as Prescribed: Yes Pain Present Now: No Electronic Signature(s) Signed: 06/01/2020 5:45:26 PM By: Levan Hurst RN, BSN Entered By: Levan Hurst on 05/30/2020 07:54:53 -------------------------------------------------------------------------------- Compression Therapy Details Patient Name: Date of Service: Alexandra Kern MA NDA 05/30/2020 7:30 A M Medical Record Number: [id number] Patient Account Number: [id number] Date of Birth/Sex: Treating RN: [birth year] (39 y.o. [name] Primary Care Treston Coker: [place] Billey Gosling RD Other Clinician: Referring Prim Morace: Treating Doyel Mulkern/Extender: Worthy Keeler [place] Billey Gosling RD Weeks in Treatment: 3 Compression Therapy Performed for Wound Assessment: Wound #1  Left,Anterior Lower Leg Performed By: Clinician Carlene Coria, RN Compression Type: Four Layer Post Procedure Diagnosis Same as Pre-procedure Electronic Signature(s) Signed: 05/30/2020 4:25:30 PM By: Baruch Gouty RN, BSN Entered By: Baruch Gouty on 05/30/2020 08:18:59 -------------------------------------------------------------------------------- Encounter Discharge Information Details Patient Name: Date of Service: Alexandra Kern MA NDA 05/30/2020 7:30 A M Medical Record Number: [id number] Patient Account Number: [id number] Date of Birth/Sex: Treating RN: [birth year] (39 y.o. Orvan Falconer Primary Care Rajendra Spiller: [place] Billey Gosling RD Other Clinician: Referring Edgar Reisz: Treating Linnaea Ahn/Extender: Albertine Grates RD Weeks in Treatment: 3 Encounter Discharge Information Items Discharge Condition: Stable Ambulatory Status: Ambulatory Discharge Destination: Home Transportation: Private Auto Accompanied By: self Schedule Follow-up Appointment: Yes Clinical Summary of Care: Patient Declined Electronic Signature(s) Signed: 05/30/2020 4:51:47 PM By: Carlene Coria RN Entered By: Carlene Coria on 05/30/2020 08:44:07 -------------------------------------------------------------------------------- Lower Extremity Assessment Details Patient Name: Date of Service: Petty, ERSKIN MA NDA 05/30/2020 7:30 A M Medical Record Number: [id number] Patient Account Number: [id number] Date of Birth/Sex: Treating RN: 1981-07-13 (39 y.o. Nancy Fetter Primary Care Ivoree Felmlee: [place] Billey Gosling RD Other Clinician: Referring Mayjor Ager: Treating Melissa Tomaselli/Extender: Worthy Keeler [place] RRO Kristopher Oppenheim RD Weeks in Treatment: 3 Edema Assessment Assessed: [Left: No] [Right: No] Edema: [Left: Ye] [Right: s] Calf Left: Right: Point of Measurement: 30 cm From Medial Instep 43 cm cm Ankle Left: Right: Point of Measurement: 12 cm From Medial Instep 21.4 cm cm Vascular Assessment Pulses: Dorsalis  Pedis Palpable: [Left:Yes] Electronic Signature(s) Signed: 06/01/2020 5:45:26 PM By: Levan Hurst RN, BSN Entered By: Levan Hurst on 05/30/2020 08:02:26 -------------------------------------------------------------------------------- Multi-Disciplinary Care Plan Details Patient Name: Date of Service: Alexandra Kern MA NDA 05/30/2020 7:30 A M Medical Record Number: [id number] Patient Account Number: [id number] Date of Birth/Sex: Treating RN: [birth year] (39 y.o. [name] Primary Care Jahson Emanuele: [place] Billey Gosling RD Other Clinician: Referring Antonetta Clanton: Treating  Tamma Brigandi/Extender: Worthy Keeler MO RRO Viona Gilmore, RICHA RD Weeks in Treatment: 3 Active Inactive Necrotic Tissue Nursing Diagnoses: Impaired tissue integrity related to necrotic/devitalized tissue Knowledge deficit related to management of necrotic/devitalized tissue Goals: Necrotic/devitalized tissue will be minimized in the wound bed Date Initiated: 05/09/2020 Target Resolution Date: 06/06/2020 Goal Status: Active Patient/caregiver will verbalize understanding of reason and process for debridement of necrotic tissue Date Initiated: 05/09/2020 Target Resolution Date: 06/06/2020 Goal Status: Active Interventions: Assess patient pain level pre-, during and post procedure and prior to discharge Provide education on necrotic tissue and debridement process Treatment Activities: Apply topical anesthetic as ordered : 05/09/2020 Excisional debridement : 05/09/2020 Notes: Wound/Skin Impairment Nursing Diagnoses: Impaired tissue integrity Knowledge deficit related to ulceration/compromised skin integrity Goals: Patient/caregiver will verbalize understanding of skin care regimen Date Initiated: 05/09/2020 Target Resolution Date: 06/06/2020 Goal Status: Active Ulcer/skin breakdown will have a volume reduction of 30% by week 4 Date Initiated: 05/09/2020 Target Resolution Date: 06/06/2020 Goal Status: Active Interventions: Assess  patient/caregiver ability to obtain necessary supplies Assess patient/caregiver ability to perform ulcer/skin care regimen upon admission and as needed Assess ulceration(s) every visit Provide education on ulcer and skin care Treatment Activities: Skin care regimen initiated : 05/09/2020 Topical wound management initiated : 05/09/2020 Notes: Electronic Signature(s) Signed: 05/30/2020 4:25:30 PM By: Baruch Gouty RN, BSN Entered By: Baruch Gouty on 05/30/2020 08:00:29 -------------------------------------------------------------------------------- Pain Assessment Details Patient Name: Date of Service: Alexandra Kern MA NDA 05/30/2020 7:30 A M Medical Record Number: [id number] Patient Account Number: [id number] Date of Birth/Sex: Treating RN: Feb 26, 1981 (39 y.o. Nancy Fetter Primary Care Karyna Bessler: [place] Billey Gosling RD Other Clinician: Referring Abbegail Matuska: Treating Raequon Catanzaro/Extender: Worthy Keeler [place] Billey Gosling RD Weeks in Treatment: 3 Active Problems Location of Pain Severity and Description of Pain Patient Has Paino No Site Locations Pain Management and Medication Current Pain Management: Electronic Signature(s) Signed: 06/01/2020 5:45:26 PM By: Levan Hurst RN, BSN Entered By: Levan Hurst on 05/30/2020 07:55:13 -------------------------------------------------------------------------------- Patient/Caregiver Education Details Patient Name: Date of Service: Alexandra Kern MA NDA 6/23/2021andnbsp7:30 A M Medical Record Number: [id number] Patient Account Number: [id number] Date of Birth/Gender: Treating RN: 1981/07/10 (39 y.o. [name] Primary Care Physician: [place], Stanley RD Other Clinician: Referring Physician: Treating Physician/Extender: Albertine Grates RD Weeks in Treatment: 3 Education Assessment Education Provided To: Patient Education Topics Provided Venous: Methods: Explain/Verbal Responses: Reinforcements needed, State content  correctly Wound/Skin Impairment: Methods: Explain/Verbal Responses: Reinforcements needed, State content correctly Electronic Signature(s) Signed: 05/30/2020 4:25:30 PM By: Baruch Gouty RN, BSN Entered By: Baruch Gouty on 05/30/2020 08:00:51 -------------------------------------------------------------------------------- Wound Assessment Details Patient Name: Date of Service: Alexandra Kern MA NDA 05/30/2020 7:30 A M Medical Record Number: [id number] Patient Account Number: [id number] Date of Birth/Sex: Treating RN: 30-Dec-1980 (39 y.o. Nancy Fetter Primary Care Maryann Mccall: [place] Billey Gosling RD Other Clinician: Referring Samyrah Bruster: Treating Tayvian Holycross/Extender: Worthy Keeler [place] RRO Kristopher Oppenheim RD Weeks in Treatment: 3 Wound Status Wound Number: 1 Primary Etiology: Venous Leg Ulcer Wound Location: Left, Anterior Lower Leg Secondary Etiology: Trauma, Other Wounding Event: Trauma Wound Status: Open Date Acquired: 10/09/2019 Comorbid History: Hypertension Weeks Of Treatment: 3 Clustered Wound: No Photos Wound Measurements Length: (cm) 0.4 Width: (cm) 0.7 Depth: (cm) 0.1 Area: (cm) 0.22 Volume: (cm) 0.022 % Reduction in Area: 91.3% % Reduction in Volume: 95.7% Epithelialization: Large (67-100%) Tunneling: No Undermining: No Wound Description Classification: Full Thickness Without Exposed Support Structures Wound Margin: Distinct, outline attached Exudate Amount: Small Exudate Type:  Serosanguineous Exudate Color: red, brown Foul Odor After Cleansing: No Slough/Fibrino No Wound Bed Granulation Amount: Large (67-100%) Exposed Structure Granulation Quality: Red Fascia Exposed: No Necrotic Amount: None Present (0%) Fat Layer (Subcutaneous Tissue) Exposed: Yes Tendon Exposed: No Muscle Exposed: No Joint Exposed: No Bone Exposed: No Treatment Notes Wound #1 (Left, Anterior Lower Leg) 1. Cleanse With Wound Cleanser Soap and water 2. Periwound Care Moisturizing  lotion TCA Cream 3. Primary Dressing Applied Hydrofera Blue 4. Secondary Dressing Dry Gauze 6. Support Layer Applied 3 layer compression wrap Notes moistened with normal saline , netting Electronic Signature(s) Signed: 06/01/2020 5:12:49 PM By: Minerva Fester Signed: 06/01/2020 5:45:26 PM By: Levan Hurst RN, BSN Entered By: Minerva Fester on 05/31/2020 14:01:46 -------------------------------------------------------------------------------- [place] Details Patient Name: Date of Service: Alexandra Kern MA NDA 05/30/2020 7:30 A M Medical Record Number: [id number] Patient Account Number: [id number] Date of Birth/Sex: Treating RN: 30-May-1981 (39 y.o. Nancy Fetter Primary Care Jada Kuhnert: [place] Billey Gosling RD Other Clinician: Referring Tristian Sickinger: Treating Nuria Phebus/Extender: Worthy Keeler [place] RRO Kristopher Oppenheim RD Weeks in Treatment: 3 Vital Signs Time Taken: 07:54 Temperature (F): 98.9 Height (in): 69 Pulse (bpm): 86 Weight (lbs): 283 Respiratory Rate (breaths/min): 18 Body Mass Index (BMI): 41.8 Blood Pressure (mmHg): 140/91 Reference Range: 80 - 120 mg / dl Electronic Signature(s) Signed: 06/01/2020 5:45:26 PM By: Levan Hurst RN, BSN Entered By: Levan Hurst on 05/30/2020 07:55:09

## 2020-06-01 NOTE — Progress Notes (Signed)
ROZETTA, STUMPP ([id number]) Visit Report for 05/30/2020 Chief Complaint Document Details Patient Name: Date of Service: Alexandra Petty, Alexandra Petty [place] NDA 05/30/2020 7:30 A M Medical Record Number: [id number] Patient Account Number: [id number] Date of Birth/Sex: Treating RN: 1981/05/26 (39 y.o. Alexandra Petty Primary Care Provider: [place] Alexandra Petty RD Other Clinician: Referring Provider: Treating Provider/Extender: Albertine Grates RD Weeks in Treatment: 3 Information Obtained from: Patient Chief Complaint Left leg ulcer Electronic Signature(s) Signed: 06/01/2020 3:47:20 PM By: Worthy Keeler PA-C Entered By: Worthy Keeler on 05/30/2020 08:15:59 -------------------------------------------------------------------------------- HPI Details Patient Name: Date of Service: Alexandra Kern MA NDA 05/30/2020 7:30 A M Medical Record Number: [id number] Patient Account Number: [id number] Date of Birth/Sex: Treating RN: Apr 01, 1981 (61 y.o. Alexandra Petty Primary Care Provider: [place] Alexandra Petty RD Other Clinician: Referring Provider: Treating Provider/Extender: Worthy Keeler [place] Alexandra Petty RD Weeks in Treatment: 3 History of Present Illness HPI Description: 05/09/2020 upon evaluation today patient actually appears to be doing somewhat poorly in regard to her left lower extremity anteriorly. She states that she had an injury where she fell down her basement stairs on November 2020. Subsequently she developed initially bruising and then a hematoma underneath of the necrosis of tissue over the surface of the hematoma. In the matter of months since that time she has gone to several providers and most recently her primary care provider on May 14 who did do a culture initially she was placed on Bactrim that was subsequently switched to Cipro based on the Pseudomonas culture. Nonetheless the patient seems to be doing much better and states the pain has been much better since that time. Fortunately there  is no evidence of active infection which is great news systemically or locally. There does not appear to be signs of overall worsening in my opinion based on what we see. She does have hypertension and does appear to have some lower extremity edema as well but otherwise I do not see any evidence that she has major medical problems contributing to this other than just the fact that she had underlying hematoma. 05/16/2020 upon evaluation today patient appears to be doing well visually except for the wound is slightly larger than what it was last week. She unfortunately had a lot of drainage including some odor as well that has her somewhat concerned. Fortunately there is no signs of active infection at this time. No fever chills noted. Infected actually appears to be less erythematous than last week nonetheless I still am not opposed to putting her on an antibiotic to try to help take care of this. The overall size of the wound is actually smaller as compared to previous which is good news. 05/23/2020 upon evaluation today patient appears to be doing excellent in regard to her wound. She has been tolerating the dressing changes without complication. The good news is her antibiotic seems to be doing a great job of treating the Pseudomonas infection which was noted that she had. Fortunately there is no evidence of active infection systemically and even locally this appears to be doing much better. In general I think she is on the right course here. 05/30/2020 on evaluation today patient actually appears to be doing quite well with regard to her wound. In fact the silver nitrate seem to have made an excellent improvement in her wound last week. Fortunately there is no signs of active infection which is great news. No fevers, chills, nausea, vomiting, or diarrhea. Electronic Signature(s)  Signed: 05/30/2020 8:53:29 AM By: Worthy Keeler PA-C Entered By: Worthy Keeler on 05/30/2020  08:53:29 -------------------------------------------------------------------------------- Physical Exam Details Patient Name: Date of Service: Alexandra Petty, [name] MA NDA 05/30/2020 7:30 A M Medical Record Number: [id number] Patient Account Number: [id number] Date of Birth/Sex: Treating RN: September 17, 1981 (31 y.o. Alexandra Petty Primary Care Provider: [place] Alexandra Petty RD Other Clinician: Referring Provider: Treating Provider/Extender: Worthy Keeler MO RRO Alexandra Petty RD Weeks in Treatment: 3 Constitutional Well-nourished and well-hydrated in no acute distress. Respiratory normal breathing without difficulty. Psychiatric this patient is able to make decisions and demonstrates good insight into disease process. Alert and Oriented x 3. pleasant and cooperative. Notes Upon inspection the patient's wound bed actually showed signs of excellent epithelization. It was not as apparent but she did apparently have a sinus tract on the 12:00 location of the wound which does have a little bit of thick drainage but does not appear to be infected in my opinion. I did clean this out to the best of my ability and subsequently we will see how this progresses. I am hopeful that this will be able to reattach now that we clean some of this out we will keep an eye on it over the next week. Electronic Signature(s) Signed: 05/30/2020 8:54:06 AM By: Worthy Keeler PA-C Entered By: Worthy Keeler on 05/30/2020 08:54:05 -------------------------------------------------------------------------------- Physician Orders Details Patient Name: Date of Service: Alexandra Kern MA NDA 05/30/2020 7:30 A M Medical Record Number: [id number] Patient Account Number: [id number] Date of Birth/Sex: Treating RN: Mar 03, 1981 (68 y.o. Alexandra Petty Primary Care Provider: [place] Alexandra Petty RD Other Clinician: Referring Provider: Treating Provider/Extender: Worthy Keeler MO Alexandra Petty RD Weeks in Treatment: 3 Verbal / Phone Orders:  No Diagnosis Coding ICD-10 Coding Code Description I87.2 Venous insufficiency (chronic) (peripheral) S81.802A Unspecified open wound, left lower leg, initial encounter L97.822 Non-pressure chronic ulcer of other part of left lower leg with fat layer exposed [place] (primary) hypertension Follow-up Appointments Return Appointment in 1 week. Dressing Change Frequency Do not change entire dressing for one week. Skin Barriers/Peri-Wound Care Wound #1 Left,Anterior Lower Leg Moisturizing lotion - to leg TCA Cream or Ointment - mix with lotion to leg Wound Cleansing May shower with protection. Primary Wound Dressing Wound #1 Left,Anterior Lower Leg Hydrofera Blue - classic moistened with saline Secondary Dressing Wound #1 Left,Anterior Lower Leg Dry Gauze Edema Control 3 Layer Compression System - Left Lower Extremity Avoid standing for long periods of time Elevate legs to the level of the heart or above for 30 minutes daily and/or when sitting, a frequency of: - throughout the day Exercise regularly Electronic Signature(s) Signed: 05/30/2020 4:25:30 PM By: Baruch Gouty RN, BSN Signed: 06/01/2020 3:47:20 PM By: Worthy Keeler PA-C Entered By: Baruch Gouty on 05/30/2020 08:23:19 -------------------------------------------------------------------------------- Problem List Details Patient Name: Date of Service: Alexandra Kern MA NDA 05/30/2020 7:30 A M Medical Record Number: [id number] Patient Account Number: [id number] Date of Birth/Sex: Treating RN: Feb 28, 1981 (17 y.o. Alexandra Petty Primary Care Provider: [place] Alexandra Petty RD Other Clinician: Referring Provider: Treating Provider/Extender: Worthy Keeler [place] Alexandra Petty RD Weeks in Treatment: 3 Active Problems ICD-10 Encounter Code Description Active Date MDM Diagnosis I87.2 Venous insufficiency (chronic) (peripheral) 05/09/2020 No Yes S81.802A Unspecified open wound, left lower leg, initial encounter 05/09/2020 No  Yes L97.822 Non-pressure chronic ulcer of other part of left lower leg with fat layer exposed6/01/2020 No Yes I10 Essential (primary) hypertension 05/09/2020 No Yes  Inactive Problems Resolved Problems Electronic Signature(s) Signed: 06/01/2020 3:47:20 PM By: Worthy Keeler PA-C Entered By: Worthy Keeler on 05/30/2020 08:15:52 -------------------------------------------------------------------------------- Progress Note Details Patient Name: Date of Service: Alexandra Kern MA NDA 05/30/2020 7:30 A M Medical Record Number: [id number] Patient Account Number: [id number] Date of Birth/Sex: Treating RN: 11-18-1981 (69 y.o. Alexandra Petty Primary Care Provider: [place] Alexandra Petty RD Other Clinician: Referring Provider: Treating Provider/Extender: Worthy Keeler MO Alexandra Petty RD Weeks in Treatment: 3 Subjective Chief Complaint Information obtained from Patient Left leg ulcer History of Present Illness (HPI) 05/09/2020 upon evaluation today patient actually appears to be doing somewhat poorly in regard to her left lower extremity anteriorly. She states that she had an injury where she fell down her basement stairs on November 2020. Subsequently she developed initially bruising and then a hematoma underneath of the necrosis of tissue over the surface of the hematoma. In the matter of months since that time she has gone to several providers and most recently her primary care provider on May 14 who did do a culture initially she was placed on Bactrim that was subsequently switched to Cipro based on the Pseudomonas culture. Nonetheless the patient seems to be doing much better and states the pain has been much better since that time. Fortunately there is no evidence of active infection which is great news systemically or locally. There does not appear to be signs of overall worsening in my opinion based on what we see. She does have hypertension and does appear to have some lower extremity edema as well  but otherwise I do not see any evidence that she has major medical problems contributing to this other than just the fact that she had underlying hematoma. 05/16/2020 upon evaluation today patient appears to be doing well visually except for the wound is slightly larger than what it was last week. She unfortunately had a lot of drainage including some odor as well that has her somewhat concerned. Fortunately there is no signs of active infection at this time. No fever chills noted. Infected actually appears to be less erythematous than last week nonetheless I still am not opposed to putting her on an antibiotic to try to help take care of this. The overall size of the wound is actually smaller as compared to previous which is good news. 05/23/2020 upon evaluation today patient appears to be doing excellent in regard to her wound. She has been tolerating the dressing changes without complication. The good news is her antibiotic seems to be doing a great job of treating the Pseudomonas infection which was noted that she had. Fortunately there is no evidence of active infection systemically and even locally this appears to be doing much better. In general I think she is on the right course here. 05/30/2020 on evaluation today patient actually appears to be doing quite well with regard to her wound. In fact the silver nitrate seem to have made an excellent improvement in her wound last week. Fortunately there is no signs of active infection which is great news. No fevers, chills, nausea, vomiting, or diarrhea. Objective Constitutional Well-nourished and well-hydrated in no acute distress. Vitals Time Taken: 7:54 AM, Height: 69 in, Weight: 283 lbs, BMI: 41.8, Temperature: 98.9 F, Pulse: 86 bpm, Respiratory Rate: 18 breaths/min, Blood Pressure: 140/91 mmHg. Respiratory normal breathing without difficulty. Psychiatric this patient is able to make decisions and demonstrates good insight into disease  process. Alert and Oriented x 3. pleasant and cooperative. General  Notes: Upon inspection the patient's wound bed actually showed signs of excellent epithelization. It was not as apparent but she did apparently have a sinus tract on the 12:00 location of the wound which does have a little bit of thick drainage but does not appear to be infected in my opinion. I did clean this out to the best of my ability and subsequently we will see how this progresses. I am hopeful that this will be able to reattach now that we clean some of this out we will keep an eye on it over the next week. Integumentary (Hair, Skin) Wound #1 status is Open. Original cause of wound was Trauma. The wound is located on the Left,Anterior Lower Leg. The wound measures 0.4cm length x 0.7cm width x 0.1cm depth; 0.22cm^2 area and 0.022cm^3 volume. There is Fat Layer (Subcutaneous Tissue) Exposed exposed. There is no tunneling or undermining noted. There is a small amount of serosanguineous drainage noted. The wound margin is distinct with the outline attached to the wound base. There is large (67-100%) red granulation within the wound bed. There is no necrotic tissue within the wound bed. Assessment Active Problems ICD-10 Venous insufficiency (chronic) (peripheral) Unspecified open wound, left lower leg, initial encounter Non-pressure chronic ulcer of other part of left lower leg with fat layer exposed Essential (primary) hypertension Procedures Wound #1 Pre-procedure diagnosis of Wound #1 is a Venous Leg Ulcer located on the Left,Anterior Lower Leg . There was a Four Layer Compression Therapy Procedure by Carlene Coria, RN. Post procedure Diagnosis Wound #1: Same as Pre-Procedure Plan Follow-up Appointments: Return Appointment in 1 week. Dressing Change Frequency: Do not change entire dressing for one week. Skin Barriers/Peri-Wound Care: Wound #1 Left,Anterior Lower Leg: Moisturizing lotion - to leg TCA Cream or  Ointment - mix with lotion to leg Wound Cleansing: May shower with protection. Primary Wound Dressing: Wound #1 Left,Anterior Lower Leg: Hydrofera Blue - classic moistened with saline Secondary Dressing: Wound #1 Left,Anterior Lower Leg: Dry Gauze Edema Control: 3 Layer Compression System - Left Lower Extremity Avoid standing for long periods of time Elevate legs to the level of the heart or above for 30 minutes daily and/or when sitting, a frequency of: - throughout the day Exercise regularly 1. For at least 1 more week I do think the patient is going require the compression wrapping. She is in agreement with that plan. 2. With regard to the [facility] dressing I would recommend we stick with that for this week I think that has done well for her. 3. I am also can recommend at this time that with regard to the small sinus tract/tunnel area we try to allow this to reattach. I will keep an eye on this and see where things stand next week. If it does not seem to be reattaching we may switch to collagen in this area we shall see. We will see patient back for reevaluation in 1 week here in the clinic. If anything worsens or changes patient will contact our office for additional recommendations. Electronic Signature(s) Signed: 05/30/2020 8:54:53 AM By: Worthy Keeler PA-C Entered By: Worthy Keeler on 05/30/2020 08:54:53 -------------------------------------------------------------------------------- SuperBill Details Patient Name: Date of Service: Alexandra Kern MA NDA 05/30/2020 Medical Record Number: [id number] Patient Account Number: [id number] Date of Birth/Sex: Treating RN: 1981-01-11 (9 y.o. Alexandra Petty Primary Care Provider: [place] Alexandra Petty RD Other Clinician: Referring Provider: Treating Provider/Extender: Worthy Keeler [place] Alexandra Petty RD Weeks in Treatment: 3 Diagnosis Coding ICD-10  Codes Code Description I87.2 Venous insufficiency (chronic) (peripheral) S81.802A  Unspecified open wound, left lower leg, initial encounter L97.822 Non-pressure chronic ulcer of other part of left lower leg with fat layer exposed [place] (primary) hypertension Facility Procedures CPT4 Code: [id number] Description: (Facility Use Only) [phone] - [place] KORJGY LWR LT LEG Modifier: Quantity: 1 Physician Procedures : CPT4 Code Description Modifier [id number] - WC PHYS LEVEL 3 - EST PT ICD-10 Diagnosis Description I87.2 Venous insufficiency (chronic) (peripheral) S81.802A Unspecified open wound, left lower leg, initial encounter K52.591 Non-pressure chronic ulcer  of other part of left lower leg with fat layer exposed [place] (primary) hypertension Quantity: 1 Electronic Signature(s) Signed: 05/30/2020 8:55:09 AM By: Worthy Keeler PA-C Entered By: Worthy Keeler on 05/30/2020 08:55:09

## 2020-06-04 ENCOUNTER — Other Ambulatory Visit: Payer: Self-pay | Admitting: Registered Nurse

## 2020-06-04 DIAGNOSIS — F418 Other specified anxiety disorders: Secondary | ICD-10-CM

## 2020-06-06 ENCOUNTER — Encounter (HOSPITAL_BASED_OUTPATIENT_CLINIC_OR_DEPARTMENT_OTHER): Payer: 59 | Admitting: Physician Assistant

## 2020-06-06 DIAGNOSIS — I10 Essential (primary) hypertension: Secondary | ICD-10-CM | POA: Diagnosis not present

## 2020-06-06 DIAGNOSIS — Z8249 Family history of ischemic heart disease and other diseases of the circulatory system: Secondary | ICD-10-CM | POA: Diagnosis not present

## 2020-06-06 DIAGNOSIS — I872 Venous insufficiency (chronic) (peripheral): Secondary | ICD-10-CM | POA: Diagnosis not present

## 2020-06-06 DIAGNOSIS — L97822 Non-pressure chronic ulcer of other part of left lower leg with fat layer exposed: Secondary | ICD-10-CM | POA: Diagnosis not present

## 2020-06-06 DIAGNOSIS — Z809 Family history of malignant neoplasm, unspecified: Secondary | ICD-10-CM | POA: Diagnosis not present

## 2020-06-06 NOTE — Progress Notes (Signed)
Alexandra, Petty ([id number]) Visit Report for 06/06/2020 Arrival Information Details Patient Name: Date of Service: Alexandra Petty, Alexandra Petty [place] NDA 06/06/2020 7:30 A M Medical Record Number: [id number] Patient Account Number: [id number] Date of Birth/Sex: Treating RN: Jul 02, 1981 (39 y.o. Nancy Fetter Primary Care Alencia Gordon: [place] Billey Gosling RD Other Clinician: Referring Torsha Lemus: Treating Heiress Williamson/Extender: Worthy Keeler [place] RRO Kristopher Oppenheim RD Weeks in Treatment: 4 Visit Information History Since Last Visit Added or deleted any medications: No Patient Arrived: Ambulatory Any new allergies or adverse reactions: No Arrival Time: 07:46 Had a fall or experienced change in No Accompanied By: alone activities of daily living that may affect Transfer Assistance: None risk of falls: Patient Identification Verified: Yes Signs or symptoms of abuse/neglect since last visito No Secondary Verification Process Completed: Yes Hospitalized since last visit: No Patient Has Alerts: Yes Implantable device outside of the clinic excluding No Patient Alerts: Left ABI: 0.99 cellular tissue based products placed in the center since last visit: Has Dressing in Place as Prescribed: Yes Has Compression in Place as Prescribed: Yes Pain Present Now: No Electronic Signature(s) Signed: 06/06/2020 5:49:00 PM By: Levan Hurst RN, BSN Entered By: Levan Hurst on 06/06/2020 07:47:11 -------------------------------------------------------------------------------- Clinic Level of Care Assessment Details Patient Name: Date of Service: Alexandra Kern MA NDA 06/06/2020 7:30 A M Medical Record Number: [id number] Patient Account Number: [id number] Date of Birth/Sex: Treating RN: 04/22/1981 (75 y.o. Alexandra Petty Primary Care Wade Sigala: [place] Billey Gosling RD Other Clinician: Referring Johndavid Geralds: Treating Elzie Sheets/Extender: Worthy Keeler [place] RRO Kristopher Oppenheim RD Weeks in Treatment: 4 Clinic Level of Care Assessment Items TOOL 4  Quantity Score []  - 0 Use when only an EandM is performed on FOLLOW-UP visit ASSESSMENTS - Nursing Assessment / Reassessment X- 1 10 Reassessment of Co-morbidities (includes updates in patient status) X- 1 5 Reassessment of Adherence to Treatment Plan ASSESSMENTS - Wound and Skin A ssessment / Reassessment X - Simple Wound Assessment / Reassessment - one wound 1 5 []  - 0 Complex Wound Assessment / Reassessment - multiple wounds []  - 0 Dermatologic / Skin Assessment (not related to wound area) ASSESSMENTS - Focused Assessment X- 1 5 Circumferential Edema Measurements - multi extremities []  - 0 Nutritional Assessment / Counseling / Intervention X- 1 5 Lower Extremity Assessment (monofilament, tuning fork, pulses) []  - 0 Peripheral Arterial Disease Assessment (using hand held doppler) ASSESSMENTS - Ostomy and/or Continence Assessment and Care []  - 0 Incontinence Assessment and Management []  - 0 Ostomy Care Assessment and Management (repouching, etc.) PROCESS - Coordination of Care X - Simple Patient / Family Education for ongoing care 1 15 []  - 0 Complex (extensive) Patient / Family Education for ongoing care X- 1 10 Staff obtains Programmer, systems, Records, T Results / Process Orders est []  - 0 Staff telephones HHA, Nursing Homes / Clarify orders / etc []  - 0 Routine Transfer to another Facility (non-emergent condition) []  - 0 Routine Hospital Admission (non-emergent condition) []  - 0 New Admissions / Biomedical engineer / Ordering NPWT Apligraf, etc. , []  - 0 Emergency Hospital Admission (emergent condition) X- 1 10 Simple Discharge Coordination []  - 0 Complex (extensive) Discharge Coordination PROCESS - Special Needs []  - 0 Pediatric / Minor Patient Management []  - 0 Isolation Patient Management []  - 0 Hearing / Language / Visual special needs []  - 0 Assessment of Community assistance (transportation, D/C planning, etc.) []  - 0 Additional assistance / Altered  mentation []  - 0 Support Surface(s) Assessment (bed, cushion, seat, etc.) INTERVENTIONS - Wound  Cleansing / Measurement X - Simple Wound Cleansing - one wound 1 5 []  - 0 Complex Wound Cleansing - multiple wounds X- 1 5 Wound Imaging (photographs - any number of wounds) []  - 0 Wound Tracing (instead of photographs) X- 1 5 Simple Wound Measurement - one wound []  - 0 Complex Wound Measurement - multiple wounds INTERVENTIONS - Wound Dressings X - Small Wound Dressing one or multiple wounds 1 10 []  - 0 Medium Wound Dressing one or multiple wounds []  - 0 Large Wound Dressing one or multiple wounds []  - 0 Application of Medications - topical []  - 0 Application of Medications - injection INTERVENTIONS - Miscellaneous []  - 0 External ear exam []  - 0 Specimen Collection (cultures, biopsies, blood, body fluids, etc.) []  - 0 Specimen(s) / Culture(s) sent or taken to Lab for analysis []  - 0 Patient Transfer (multiple staff / Civil Service fast streamer / Similar devices) []  - 0 Simple Staple / Suture removal (25 or less) []  - 0 Complex Staple / Suture removal (26 or more) []  - 0 Hypo / Hyperglycemic Management (close monitor of Blood Glucose) []  - 0 Ankle / Brachial Index (ABI) - do not check if billed separately X- 1 5 Vital Signs Has the patient been seen at the hospital within the last three years: Yes Total Score: 95 Level Of Care: New/Established - Level 3 Electronic Signature(s) Signed: 06/06/2020 5:49:12 PM By: Baruch Gouty RN, BSN Entered By: Baruch Gouty on 06/06/2020 08:33:56 -------------------------------------------------------------------------------- Encounter Discharge Information Details Patient Name: Date of Service: Alexandra Kern MA NDA 06/06/2020 7:30 A M Medical Record Number: [id number] Patient Account Number: [id number] Date of Birth/Sex: Treating RN: 07/06/1981 (1 y.o. Alexandra Petty Primary Care Raliegh Scobie: [place] Billey Gosling RD Other Clinician: Referring  Bergen Melle: Treating Liberty Seto/Extender: Albertine Grates RD Weeks in Treatment: 4 Encounter Discharge Information Items Discharge Condition: Stable Ambulatory Status: Ambulatory Discharge Destination: Home Transportation: Private Auto Accompanied By: self Schedule Follow-up Appointment: Yes Clinical Summary of Care: Patient Declined Electronic Signature(s) Signed: 06/06/2020 5:49:12 PM By: Baruch Gouty RN, BSN Entered By: Baruch Gouty on 06/06/2020 17:06:01 -------------------------------------------------------------------------------- Lower Extremity Assessment Details Patient Name: Date of Service: Alexandra Kern MA NDA 06/06/2020 7:30 A M Medical Record Number: [id number] Patient Account Number: [id number] Date of Birth/Sex: Treating RN: Dec 17, 1980 (39 y.o. Nancy Fetter Primary Care Mi Balla: [place] Billey Gosling RD Other Clinician: Referring Daryana Whirley: Treating Coden Franchi/Extender: Worthy Keeler [place] RRO Kristopher Oppenheim RD Weeks in Treatment: 4 Edema Assessment Assessed: [Left: No] [Right: No] Edema: [Left: Ye] [Right: s] Calf Left: Right: Point of Measurement: 30 cm From Medial Instep 42.5 cm cm Ankle Left: Right: Point of Measurement: 12 cm From Medial Instep 21.5 cm cm Vascular Assessment Pulses: Dorsalis Pedis Palpable: [Left:Yes] Electronic Signature(s) Signed: 06/06/2020 5:49:00 PM By: Levan Hurst RN, BSN Entered By: Levan Hurst on 06/06/2020 07:55:05 -------------------------------------------------------------------------------- Multi-Disciplinary Care Plan Details Patient Name: Date of Service: Alexandra Kern MA NDA 06/06/2020 7:30 A M Medical Record Number: [id number] Patient Account Number: [id number] Date of Birth/Sex: Treating RN: [birth year] (43 y.o. Alexandra Petty Primary Care Nelvin Tomb: [place] Billey Gosling RD Other Clinician: Referring Kadi Hession: Treating Jessica Checketts/Extender: Worthy Keeler [place] Billey Gosling RD Weeks in Treatment: 4 Active  Inactive Wound/Skin Impairment Nursing Diagnoses: Impaired tissue integrity Knowledge deficit related to ulceration/compromised skin integrity Goals: Patient/caregiver will verbalize understanding of skin care regimen Date Initiated: 05/09/2020 Target Resolution Date: 07/04/2020 Goal Status: Active Ulcer/skin breakdown will have a volume reduction of 30% by  week 4 Date Initiated: 05/09/2020 Date Inactivated: 06/06/2020 Target Resolution Date: 06/06/2020 Goal Status: Met Ulcer/skin breakdown will have a volume reduction of 50% by week 8 Date Initiated: 06/06/2020 Target Resolution Date: 07/04/2020 Goal Status: Active Interventions: Assess patient/caregiver ability to obtain necessary supplies Assess patient/caregiver ability to perform ulcer/skin care regimen upon admission and as needed Assess ulceration(s) every visit Provide education on ulcer and skin care Treatment Activities: Skin care regimen initiated : 05/09/2020 Topical wound management initiated : 05/09/2020 Notes: Electronic Signature(s) Signed: 06/06/2020 5:49:12 PM By: Baruch Gouty RN, BSN Entered By: Baruch Gouty on 06/06/2020 08:04:46 -------------------------------------------------------------------------------- Pain Assessment Details Patient Name: Date of Service: Alexandra Kern MA NDA 06/06/2020 7:30 A M Medical Record Number: [id number] Patient Account Number: [id number] Date of Birth/Sex: Treating RN: June 03, 1981 (39 y.o. Nancy Fetter Primary Care Darrien Belter: [place] Billey Gosling RD Other Clinician: Referring Emmett Arntz: Treating Paislie Tessler/Extender: Worthy Keeler [place] Billey Gosling RD Weeks in Treatment: 4 Active Problems Location of Pain Severity and Description of Pain Patient Has Paino No Site Locations Pain Management and Medication Current Pain Management: Electronic Signature(s) Signed: 06/06/2020 5:49:00 PM By: Levan Hurst RN, BSN Entered By: Levan Hurst on 06/06/2020  07:47:35 -------------------------------------------------------------------------------- Patient/Caregiver Education Details Patient Name: Date of Service: Alexandra Kern MA NDA 6/30/2021andnbsp7:30 A M Medical Record Number: [id number] Patient Account Number: [id number] Date of Birth/Gender: Treating RN: 23-Jul-1981 (40 y.o. Alexandra Petty Primary Care Physician: [place], Victor RD Other Clinician: Referring Physician: Treating Physician/Extender: Albertine Grates RD Weeks in Treatment: 4 Education Assessment Education Provided To: Patient Education Topics Provided Venous: Methods: Explain/Verbal Responses: Reinforcements needed, State content correctly Wound/Skin Impairment: Methods: Explain/Verbal Responses: Reinforcements needed, State content correctly Electronic Signature(s) Signed: 06/06/2020 5:49:12 PM By: Baruch Gouty RN, BSN Entered By: Baruch Gouty on 06/06/2020 08:05:04 -------------------------------------------------------------------------------- Wound Assessment Details Patient Name: Date of Service: Alexandra Kern MA NDA 06/06/2020 7:30 A M Medical Record Number: [id number] Patient Account Number: [id number] Date of Birth/Sex: Treating RN: 1981/03/13 (39 y.o. Nancy Fetter Primary Care Kestrel Mis: [place] Billey Gosling RD Other Clinician: Referring Wilberta Dorvil: Treating Deshan Hemmelgarn/Extender: Worthy Keeler [place] RRO Kristopher Oppenheim RD Weeks in Treatment: 4 Wound Status Wound Number: 1 Primary Etiology: Venous Leg Ulcer Wound Location: Left, Anterior Lower Leg Secondary Etiology: Trauma, Other Wounding Event: Trauma Wound Status: Open Date Acquired: 10/09/2019 Comorbid History: Hypertension Weeks Of Treatment: 4 Clustered Wound: No Wound Measurements Length: (cm) 0.2 Width: (cm) 0.1 Depth: (cm) 0.2 Area: (cm) 0.016 Volume: (cm) 0.003 % Reduction in Area: 99.4% % Reduction in Volume: 99.4% Epithelialization: Large (67-100%) Tunneling:  Yes Position (o'clock): 12 Maximum Distance: (cm) 1.4 Undermining: No Wound Description Classification: Full Thickness Without Exposed Support Structures Wound Margin: Distinct, outline attached Exudate Amount: Small Exudate Type: Serosanguineous Exudate Color: red, brown Foul Odor After Cleansing: No Slough/Fibrino No Wound Bed Granulation Amount: Large (67-100%) Exposed Structure Granulation Quality: Pink Fascia Exposed: No Necrotic Amount: None Present (0%) Fat Layer (Subcutaneous Tissue) Exposed: Yes Tendon Exposed: No Muscle Exposed: No Joint Exposed: No Bone Exposed: No Treatment Notes Wound #1 (Left, Anterior Lower Leg) 2. Periwound Care Skin Prep 4. Secondary Dressing Foam Border Dressing 6. Support Layer Child psychotherapist) Signed: 06/06/2020 5:49:00 PM By: Levan Hurst RN, BSN Entered By: Levan Hurst on 06/06/2020 07:55:33 -------------------------------------------------------------------------------- [place] Details Patient Name: Date of Service: Alexandra Kern MA NDA 06/06/2020 7:30 A M Medical Record Number: [id number] Patient Account Number: [id number] Date of Birth/Sex: Treating RN: 1981-06-26 (39 y.o. F)  Levan Hurst Primary Care Collie Kittel: [place] Billey Gosling RD Other Clinician: Referring Cherron Blitzer: Treating Yassir Enis/Extender: Worthy Keeler [place] RRO Kristopher Oppenheim RD Weeks in Treatment: 4 Vital Signs Time Taken: 07:47 Temperature (F): 98.3 Height (in): 69 Pulse (bpm): 85 Weight (lbs): 283 Respiratory Rate (breaths/min): 18 Body Mass Index (BMI): 41.8 Blood Pressure (mmHg): 142/83 Reference Range: 80 - 120 mg / dl Electronic Signature(s) Signed: 06/06/2020 5:49:00 PM By: Levan Hurst RN, BSN Entered By: Levan Hurst on 06/06/2020 07:47:29

## 2020-06-06 NOTE — Progress Notes (Signed)
TYSON, MASIN ([id number]) Visit Report for 06/06/2020 Chief Complaint Document Details Patient Name: Date of Service: Alexandra Petty, Alexandra Petty [place] NDA 06/06/2020 7:30 A M Medical Record Number: [id number] Patient Account Number: [id number] Date of Birth/Sex: Treating RN: 03/11/1981 (39 y.o. [name] Primary Care Provider: [place] Billey Gosling RD Other Clinician: Referring Provider: Treating Provider/Extender: Albertine Grates RD Weeks in Treatment: 4 Information Obtained from: Patient Chief Complaint Left leg ulcer Electronic Signature(s) Signed: 06/06/2020 5:59:30 PM By: Worthy Keeler PA-C Entered By: Worthy Keeler on 06/06/2020 08:16:55 -------------------------------------------------------------------------------- HPI Details Patient Name: Date of Service: Alexandra Kern MA NDA 06/06/2020 7:30 A M Medical Record Number: [id number] Patient Account Number: [id number] Date of Birth/Sex: Treating RN: 10-Apr-1981 (39 y.o. [name] Primary Care Provider: [place] Billey Gosling RD Other Clinician: Referring Provider: Treating Provider/Extender: Worthy Keeler [place] Billey Gosling RD Weeks in Treatment: 4 History of Present Illness HPI Description: 05/09/2020 upon evaluation today patient actually appears to be doing somewhat poorly in regard to her left lower extremity anteriorly. She states that she had an injury where she fell down her basement stairs on November 2020. Subsequently she developed initially bruising and then a hematoma underneath of the necrosis of tissue over the surface of the hematoma. In the matter of months since that time she has gone to several providers and most recently her primary care provider on May 14 who did do a culture initially she was placed on Bactrim that was subsequently switched to Cipro based on the Pseudomonas culture. Nonetheless the patient seems to be doing much better and states the pain has been much better since that time. Fortunately there  is no evidence of active infection which is great news systemically or locally. There does not appear to be signs of overall worsening in my opinion based on what we see. She does have hypertension and does appear to have some lower extremity edema as well but otherwise I do not see any evidence that she has major medical problems contributing to this other than just the fact that she had underlying hematoma. 05/16/2020 upon evaluation today patient appears to be doing well visually except for the wound is slightly larger than what it was last week. She unfortunately had a lot of drainage including some odor as well that has her somewhat concerned. Fortunately there is no signs of active infection at this time. No fever chills noted. Infected actually appears to be less erythematous than last week nonetheless I still am not opposed to putting her on an antibiotic to try to help take care of this. The overall size of the wound is actually smaller as compared to previous which is good news. 05/23/2020 upon evaluation today patient appears to be doing excellent in regard to her wound. She has been tolerating the dressing changes without complication. The good news is her antibiotic seems to be doing a great job of treating the Pseudomonas infection which was noted that she had. Fortunately there is no evidence of active infection systemically and even locally this appears to be doing much better. In general I think she is on the right course here. 05/30/2020 on evaluation today patient actually appears to be doing quite well with regard to her wound. In fact the silver nitrate seem to have made an excellent improvement in her wound last week. Fortunately there is no signs of active infection which is great news. No fevers, chills, nausea, vomiting, or diarrhea. 06/06/2020 upon  evaluation today patient appears to be doing much better in regard to her wound. In fact the main area of the wound is completely  healed the tunnel that was noted last week extending approximately at the 12:00 location is still open at this time. I think this is can take some time to fill and to be honest it is not going to be an easy situation in general in my opinion. Nonetheless I do think that the patient is otherwise doing quite well and overall I am very pleased with the progress that she has made. Electronic Signature(s) Signed: 06/06/2020 9:07:03 AM By: Worthy Keeler PA-C Entered By: Worthy Keeler on 06/06/2020 09:07:03 -------------------------------------------------------------------------------- Physical Exam Details Patient Name: Date of Service: Alexandra Petty, CREPEAU [place] NDA 06/06/2020 7:30 A M Medical Record Number: [id number] Patient Account Number: [id number] Date of Birth/Sex: Treating RN: 1981-03-12 (39 y.o. [name] Primary Care Provider: [place] Billey Gosling RD Other Clinician: Referring Provider: Treating Provider/Extender: Worthy Keeler [place] RRO Kristopher Oppenheim RD Weeks in Treatment: 4 Constitutional Well-nourished and well-hydrated in no acute distress. Respiratory normal breathing without difficulty. Psychiatric this patient is able to make decisions and demonstrates good insight into disease process. Alert and Oriented x 3. pleasant and cooperative. Notes Patient's wound bed currently showed signs of good granulation at this time and excellent epithelization she does have the small tunnel but again this is so minimal that I am not even sure we can really get collagen and there appropriately. For that reason my suggestion currently is going to be that we likely avoid the collagen just using a protective dressing over top and the [place] crucial issue will probably be to continue to wear compression though I think we can transition to compression socks for her. Electronic Signature(s) Signed: 06/06/2020 9:07:33 AM By: Worthy Keeler PA-C Entered By: Worthy Keeler on 06/06/2020  09:07:33 -------------------------------------------------------------------------------- Physician Orders Details Patient Name: Date of Service: Alexandra Kern MA NDA 06/06/2020 7:30 A M Medical Record Number: [id number] Patient Account Number: [id number] Date of Birth/Sex: Treating RN: Apr 03, 1981 (39 y.o. [name] Primary Care Provider: [place] Billey Gosling RD Other Clinician: Referring Provider: Treating Provider/Extender: Worthy Keeler [place] Billey Gosling RD Weeks in Treatment: 4 Verbal / Phone Orders: No Diagnosis Coding ICD-10 Coding Code Description I87.2 Venous insufficiency (chronic) (peripheral) S81.802A Unspecified open wound, left lower leg, initial encounter L97.822 Non-pressure chronic ulcer of other part of left lower leg with fat layer exposed [place] (primary) hypertension Follow-up Appointments Return appointment in 3 weeks. Dressing Change Frequency Wound #1 Left,Anterior Lower Leg Change Dressing every third day - or as needed Skin Barriers/Peri-Wound Care Wound #1 Left,Anterior Lower Leg Moisturizing lotion - to leg daily Wound Cleansing Wound #1 Left,Anterior Lower Leg May shower and wash wound with soap and water. Secondary Dressing Wound #1 Left,Anterior Lower Leg Foam Border - or large bandaid Edema Control Avoid standing for long periods of time Elevate legs to the level of the heart or above for 30 minutes daily and/or when sitting, a frequency of: - throughout the day Exercise regularly Support Garment 20-30 mm/Hg pressure to: - compression stocking to left leg daily Electronic Signature(s) Signed: 06/06/2020 5:49:12 PM By: Baruch Gouty RN, BSN Signed: 06/06/2020 5:59:30 PM By: Worthy Keeler PA-C Entered By: Baruch Gouty on 06/06/2020 08:33:07 -------------------------------------------------------------------------------- Problem List Details Patient Name: Date of Service: Alexandra Kern MA NDA 06/06/2020 7:30 A M Medical Record Number:  [id number] Patient Account Number: [id number] Date of  Birth/Sex: Treating RN: 23-Oct-1981 (39 y.o. [name] Primary Care Provider: [place] Billey Gosling RD Other Clinician: Referring Provider: Treating Provider/Extender: Worthy Keeler MO Billey Gosling RD Weeks in Treatment: 4 Active Problems ICD-10 Encounter Code Description Active Date MDM Diagnosis I87.2 Venous insufficiency (chronic) (peripheral) 05/09/2020 No Yes S81.802A Unspecified open wound, left lower leg, initial encounter 05/09/2020 No Yes L97.822 Non-pressure chronic ulcer of other part of left lower leg with fat layer exposed6/01/2020 No Yes I10 Essential (primary) hypertension 05/09/2020 No Yes Inactive Problems Resolved Problems Electronic Signature(s) Signed: 06/06/2020 5:59:30 PM By: Worthy Keeler PA-C Entered By: Worthy Keeler on 06/06/2020 08:16:48 -------------------------------------------------------------------------------- Progress Note Details Patient Name: Date of Service: Alexandra Kern MA NDA 06/06/2020 7:30 A M Medical Record Number: [id number] Patient Account Number: [id number] Date of Birth/Sex: Treating RN: 04-23-1981 (39 y.o. [name] Primary Care Provider: [place] Billey Gosling RD Other Clinician: Referring Provider: Treating Provider/Extender: Worthy Keeler [place] Billey Gosling RD Weeks in Treatment: 4 Subjective Chief Complaint Information obtained from Patient Left leg ulcer History of Present Illness (HPI) 05/09/2020 upon evaluation today patient actually appears to be doing somewhat poorly in regard to her left lower extremity anteriorly. She states that she had an injury where she fell down her basement stairs on November 2020. Subsequently she developed initially bruising and then a hematoma underneath of the necrosis of tissue over the surface of the hematoma. In the matter of months since that time she has gone to several providers and most recently her primary care provider on May 14 who  did do a culture initially she was placed on Bactrim that was subsequently switched to Cipro based on the Pseudomonas culture. Nonetheless the patient seems to be doing much better and states the pain has been much better since that time. Fortunately there is no evidence of active infection which is great news systemically or locally. There does not appear to be signs of overall worsening in my opinion based on what we see. She does have hypertension and does appear to have some lower extremity edema as well but otherwise I do not see any evidence that she has major medical problems contributing to this other than just the fact that she had underlying hematoma. 05/16/2020 upon evaluation today patient appears to be doing well visually except for the wound is slightly larger than what it was last week. She unfortunately had a lot of drainage including some odor as well that has her somewhat concerned. Fortunately there is no signs of active infection at this time. No fever chills noted. Infected actually appears to be less erythematous than last week nonetheless I still am not opposed to putting her on an antibiotic to try to help take care of this. The overall size of the wound is actually smaller as compared to previous which is good news. 05/23/2020 upon evaluation today patient appears to be doing excellent in regard to her wound. She has been tolerating the dressing changes without complication. The good news is her antibiotic seems to be doing a great job of treating the Pseudomonas infection which was noted that she had. Fortunately there is no evidence of active infection systemically and even locally this appears to be doing much better. In general I think she is on the right course here. 05/30/2020 on evaluation today patient actually appears to be doing quite well with regard to her wound. In fact the silver nitrate seem to have made an excellent improvement in  her wound last week. Fortunately  there is no signs of active infection which is great news. No fevers, chills, nausea, vomiting, or diarrhea. 06/06/2020 upon evaluation today patient appears to be doing much better in regard to her wound. In fact the main area of the wound is completely healed the tunnel that was noted last week extending approximately at the 12:00 location is still open at this time. I think this is can take some time to fill and to be honest it is not going to be an easy situation in general in my opinion. Nonetheless I do think that the patient is otherwise doing quite well and overall I am very pleased with the progress that she has made. Objective Constitutional Well-nourished and well-hydrated in no acute distress. Vitals Time Taken: 7:47 AM, Height: 69 in, Weight: 283 lbs, BMI: 41.8, Temperature: 98.3 F, Pulse: 85 bpm, Respiratory Rate: 18 breaths/min, Blood Pressure: 142/83 mmHg. Respiratory normal breathing without difficulty. Psychiatric this patient is able to make decisions and demonstrates good insight into disease process. Alert and Oriented x 3. pleasant and cooperative. General Notes: Patient's wound bed currently showed signs of good granulation at this time and excellent epithelization she does have the small tunnel but again this is so minimal that I am not even sure we can really get collagen and there appropriately. For that reason my suggestion currently is going to be that we likely avoid the collagen just using a protective dressing over top and the [place] crucial issue will probably be to continue to wear compression though I think we can transition to compression socks for her. Integumentary (Hair, Skin) Wound #1 status is Open. Original cause of wound was Trauma. The wound is located on the Left,Anterior Lower Leg. The wound measures 0.2cm length x 0.1cm width x 0.2cm depth; 0.016cm^2 area and 0.003cm^3 volume. There is Fat Layer (Subcutaneous Tissue) Exposed exposed. There is no  undermining noted, however, there is tunneling at 12:00 with a maximum distance of 1.4cm. There is a small amount of serosanguineous drainage noted. The wound margin is distinct with the outline attached to the wound base. There is large (67-100%) pink granulation within the wound bed. There is no necrotic tissue within the wound bed. Assessment Active Problems ICD-10 Venous insufficiency (chronic) (peripheral) Unspecified open wound, left lower leg, initial encounter Non-pressure chronic ulcer of other part of left lower leg with fat layer exposed Essential (primary) hypertension Plan Follow-up Appointments: Return appointment in 3 weeks. Dressing Change Frequency: Wound #1 Left,Anterior Lower Leg: Change Dressing every third day - or as needed Skin Barriers/Peri-Wound Care: Wound #1 Left,Anterior Lower Leg: Moisturizing lotion - to leg daily Wound Cleansing: Wound #1 Left,Anterior Lower Leg: May shower and wash wound with soap and water. Secondary Dressing: Wound #1 Left,Anterior Lower Leg: Foam Border - or large bandaid Edema Control: Avoid standing for long periods of time Elevate legs to the level of the heart or above for 30 minutes daily and/or when sitting, a frequency of: - throughout the day Exercise regularly Support Garment 20-30 mm/Hg pressure to: - compression stocking to left leg daily 1. I would recommend currently that we going and continue with monitoring for any signs of worsening infection at this time. The patient is in agreement with that plan. With that being said I think that she is healing quite nicely and were at the point we can switch to her own compression socks. 2. With regard to dressings were just can put a bordered foam or large  Band-Aid over top of the wound and then subsequently I think the patient can shower as normal she is needs to not take a bath or get in a pool for the time being. 3. I would recommend she continue to elevate her legs much as  possible when she is not physically active. We will see patient back for reevaluation in 3 weeks here in the clinic. If anything worsens or changes patient will contact our office for additional recommendations. Electronic Signature(s) Signed: 06/06/2020 9:08:26 AM By: Worthy Keeler PA-C Entered By: Worthy Keeler on 06/06/2020 09:08:26 -------------------------------------------------------------------------------- SuperBill Details Patient Name: Date of Service: Alexandra Kern MA NDA 06/06/2020 Medical Record Number: [id number] Patient Account Number: [id number] Date of Birth/Sex: Treating RN: 03/08/1981 (39 y.o. [name] Primary Care Provider: [place] Billey Gosling RD Other Clinician: Referring Provider: Treating Provider/Extender: Worthy Keeler [place] Billey Gosling RD Weeks in Treatment: 4 Diagnosis Coding ICD-10 Codes Code Description I87.2 Venous insufficiency (chronic) (peripheral) S81.802A Unspecified open wound, left lower leg, initial encounter L97.822 Non-pressure chronic ulcer of other part of left lower leg with fat layer exposed [place] (primary) hypertension Facility Procedures CPT4 Code: [id number] Description: 99213 - WOUND CARE VISIT-LEV 3 EST PT Modifier: Quantity: 1 Physician Procedures : CPT4 Code Description Modifier [id number] 99213 - WC PHYS LEVEL 3 - EST PT ICD-10 Diagnosis Description I87.2 Venous insufficiency (chronic) (peripheral) S81.802A Unspecified open wound, left lower leg, initial encounter L97.822 Non-pressure chronic ulcer  of other part of left lower leg with fat layer exposed [place] (primary) hypertension Quantity: 1 Electronic Signature(s) Signed: 06/06/2020 9:08:38 AM By: Worthy Keeler PA-C Entered By: Worthy Keeler on 06/06/2020 09:08:38

## 2020-06-27 ENCOUNTER — Other Ambulatory Visit: Payer: Self-pay

## 2020-06-27 ENCOUNTER — Encounter (HOSPITAL_BASED_OUTPATIENT_CLINIC_OR_DEPARTMENT_OTHER): Payer: 59 | Attending: Physician Assistant | Admitting: Physician Assistant

## 2020-06-27 DIAGNOSIS — Z872 Personal history of diseases of the skin and subcutaneous tissue: Secondary | ICD-10-CM | POA: Diagnosis not present

## 2020-06-27 DIAGNOSIS — Z09 Encounter for follow-up examination after completed treatment for conditions other than malignant neoplasm: Secondary | ICD-10-CM | POA: Diagnosis not present

## 2020-06-27 DIAGNOSIS — I10 Essential (primary) hypertension: Secondary | ICD-10-CM | POA: Insufficient documentation

## 2020-06-27 DIAGNOSIS — I872 Venous insufficiency (chronic) (peripheral): Secondary | ICD-10-CM | POA: Diagnosis not present

## 2020-06-27 DIAGNOSIS — L97822 Non-pressure chronic ulcer of other part of left lower leg with fat layer exposed: Secondary | ICD-10-CM | POA: Diagnosis not present

## 2020-06-28 NOTE — Progress Notes (Signed)
Alexandra Petty, Alexandra Petty ([id number]) Visit Report for 06/27/2020 Chief Complaint Document Details Patient Name: Date of Service: Alexandra Petty, Alexandra Petty [place] NDA 06/27/2020 7:30 A M Medical Record Number: [id number] Patient Account Number: [id number] Date of Birth/Sex: Treating RN: July 08, 1981 (39 y.o. [name] Primary Care Provider: [place] Billey Gosling RD Other Clinician: Referring Provider: Treating Provider/Extender: Albertine Grates RD Weeks in Treatment: 7 Information Obtained from: Patient Chief Complaint Left leg ulcer Electronic Signature(s) Signed: 06/27/2020 9:05:45 AM By: Worthy Keeler PA-C Entered By: Worthy Keeler on 06/27/2020 09:05:45 -------------------------------------------------------------------------------- HPI Details Patient Name: Date of Service: Alexandra Kern MA NDA 06/27/2020 7:30 A M Medical Record Number: [id number] Patient Account Number: [id number] Date of Birth/Sex: Treating RN: 07-Mar-1981 (39 y.o. [name] Primary Care Provider: [place] Billey Gosling RD Other Clinician: Referring Provider: Treating Provider/Extender: Worthy Keeler [place] Billey Gosling RD Weeks in Treatment: 7 History of Present Illness HPI Description: 05/09/2020 upon evaluation today patient actually appears to be doing somewhat poorly in regard to her left lower extremity anteriorly. She states that she had an injury where she fell down her basement stairs on November 2020. Subsequently she developed initially bruising and then a hematoma underneath of the necrosis of tissue over the surface of the hematoma. In the matter of months since that time she has gone to several providers and most recently her primary care provider on May 14 who did do a culture initially she was placed on Bactrim that was subsequently switched to Cipro based on the Pseudomonas culture. Nonetheless the patient seems to be doing much better and states the pain has been much better since that time. Fortunately there  is no evidence of active infection which is great news systemically or locally. There does not appear to be signs of overall worsening in my opinion based on what we see. She does have hypertension and does appear to have some lower extremity edema as well but otherwise I do not see any evidence that she has major medical problems contributing to this other than just the fact that she had underlying hematoma. 05/16/2020 upon evaluation today patient appears to be doing well visually except for the wound is slightly larger than what it was last week. She unfortunately had a lot of drainage including some odor as well that has her somewhat concerned. Fortunately there is no signs of active infection at this time. No fever chills noted. Infected actually appears to be less erythematous than last week nonetheless I still am not opposed to putting her on an antibiotic to try to help take care of this. The overall size of the wound is actually smaller as compared to previous which is good news. 05/23/2020 upon evaluation today patient appears to be doing excellent in regard to her wound. She has been tolerating the dressing changes without complication. The good news is her antibiotic seems to be doing a great job of treating the Pseudomonas infection which was noted that she had. Fortunately there is no evidence of active infection systemically and even locally this appears to be doing much better. In general I think she is on the right course here. 05/30/2020 on evaluation today patient actually appears to be doing quite well with regard to her wound. In fact the silver nitrate seem to have made an excellent improvement in her wound last week. Fortunately there is no signs of active infection which is great news. No fevers, chills, nausea, vomiting, or diarrhea. 06/06/2020 upon  evaluation today patient appears to be doing much better in regard to her wound. In fact the main area of the wound is completely  healed the tunnel that was noted last week extending approximately at the 12:00 location is still open at this time. I think this is can take some time to fill and to be honest it is not going to be an easy situation in general in my opinion. Nonetheless I do think that the patient is otherwise doing quite well and overall I am very pleased with the progress that she has made. 06/27/2020 upon evaluation today patient actually appears to be doing excellent in fact she is completely healed based on what I am seeing at this point. There does not appear to be any signs of active infection at this time which is great news. No fevers, chills, nausea, vomiting, or diarrhea. Electronic Signature(s) Signed: 06/27/2020 9:06:01 AM By: Worthy Keeler PA-C Entered By: Worthy Keeler on 06/27/2020 09:06:00 -------------------------------------------------------------------------------- Physical Exam Details Patient Name: Date of Service: Alexandra Petty, MCKIVER MA NDA 06/27/2020 7:30 A M Medical Record Number: [id number] Patient Account Number: [id number] Date of Birth/Sex: Treating RN: 1981/03/13 (39 y.o. [name] Primary Care Provider: MO Billey Gosling RD Other Clinician: Referring Provider: Treating Provider/Extender: Worthy Keeler [place] RRO Kristopher Oppenheim RD Weeks in Treatment: 7 Constitutional Well-nourished and well-hydrated in no acute distress. Respiratory normal breathing without difficulty. Psychiatric this patient is able to make decisions and demonstrates good insight into disease process. Alert and Oriented x 3. pleasant and cooperative. Notes Patient's wound bed currently showed signs of good epithelization there is no signs of wound opening at this time and no drainage. Overall I feel like she has done quite well and I do not see any need for continuation of wound care therapy at this point. Electronic Signature(s) Signed: 06/27/2020 9:06:17 AM By: Worthy Keeler PA-C Entered By: Worthy Keeler  on 06/27/2020 09:06:16 -------------------------------------------------------------------------------- Physician Orders Details Patient Name: Date of Service: Alexandra Kern MA NDA 06/27/2020 7:30 A M Medical Record Number: [id number] Patient Account Number: [id number] Date of Birth/Sex: Treating RN: May 14, 1981 (39 y.o. [name] Primary Care Provider: [place] Billey Gosling RD Other Clinician: Referring Provider: Treating Provider/Extender: Worthy Keeler [place] RRO Kristopher Oppenheim RD Weeks in Treatment: 7 Verbal / Phone Orders: No Diagnosis Coding Discharge From [facility] Services Discharge from [place] lotion - to legs daily Edema Control Avoid standing for long periods of time Elevate legs to the level of the heart or above for 30 minutes daily and/or when sitting, a frequency of: - throughout the day Exercise regularly Support Garment 20-30 mm/Hg pressure to: - compression stocking to both legs daily Electronic Signature(s) Signed: 06/27/2020 6:54:26 PM By: Worthy Keeler PA-C Signed: 06/28/2020 4:56:58 PM By: Baruch Gouty RN, BSN Signed: 06/28/2020 4:56:58 PM By: Baruch Gouty RN, BSN Entered By: Baruch Gouty on 06/27/2020 08:16:02 -------------------------------------------------------------------------------- Problem List Details Patient Name: Date of Service: Alexandra Kern MA NDA 06/27/2020 7:30 A M Medical Record Number: [id number] Patient Account Number: [id number] Date of Birth/Sex: Treating RN: September 26, 1981 (39 y.o. [name] Primary Care Provider: [place] Billey Gosling RD Other Clinician: Referring Provider: Treating Provider/Extender: Worthy Keeler MO Billey Gosling RD Weeks in Treatment: 7 Active Problems ICD-10 Encounter Code Description Active Date MDM Diagnosis I87.2 Venous insufficiency (chronic) (peripheral) 05/09/2020 No Yes S81.802A Unspecified open wound, left lower leg, initial encounter 05/09/2020 No  Yes L97.822 Non-pressure  chronic ulcer of other part of left lower leg with fat layer exposed6/01/2020 No Yes I10 Essential (primary) hypertension 05/09/2020 No Yes Inactive Problems Resolved Problems Electronic Signature(s) Signed: 06/27/2020 9:05:26 AM By: Worthy Keeler PA-C Entered By: Worthy Keeler on 06/27/2020 09:05:26 -------------------------------------------------------------------------------- Progress Note Details Patient Name: Date of Service: Alexandra Kern MA NDA 06/27/2020 7:30 A M Medical Record Number: [id number] Patient Account Number: [id number] Date of Birth/Sex: Treating RN: 1981-09-18 (39 y.o. [name] Primary Care Provider: [place] Billey Gosling RD Other Clinician: Referring Provider: Treating Provider/Extender: Worthy Keeler [place] Billey Gosling RD Weeks in Treatment: 7 Subjective Chief Complaint Information obtained from Patient Left leg ulcer History of Present Illness (HPI) 05/09/2020 upon evaluation today patient actually appears to be doing somewhat poorly in regard to her left lower extremity anteriorly. She states that she had an injury where she fell down her basement stairs on November 2020. Subsequently she developed initially bruising and then a hematoma underneath of the necrosis of tissue over the surface of the hematoma. In the matter of months since that time she has gone to several providers and most recently her primary care provider on May 14 who did do a culture initially she was placed on Bactrim that was subsequently switched to Cipro based on the Pseudomonas culture. Nonetheless the patient seems to be doing much better and states the pain has been much better since that time. Fortunately there is no evidence of active infection which is great news systemically or locally. There does not appear to be signs of overall worsening in my opinion based on what we see. She does have hypertension and does appear to have some lower extremity edema as  well but otherwise I do not see any evidence that she has major medical problems contributing to this other than just the fact that she had underlying hematoma. 05/16/2020 upon evaluation today patient appears to be doing well visually except for the wound is slightly larger than what it was last week. She unfortunately had a lot of drainage including some odor as well that has her somewhat concerned. Fortunately there is no signs of active infection at this time. No fever chills noted. Infected actually appears to be less erythematous than last week nonetheless I still am not opposed to putting her on an antibiotic to try to help take care of this. The overall size of the wound is actually smaller as compared to previous which is good news. 05/23/2020 upon evaluation today patient appears to be doing excellent in regard to her wound. She has been tolerating the dressing changes without complication. The good news is her antibiotic seems to be doing a great job of treating the Pseudomonas infection which was noted that she had. Fortunately there is no evidence of active infection systemically and even locally this appears to be doing much better. In general I think she is on the right course here. 05/30/2020 on evaluation today patient actually appears to be doing quite well with regard to her wound. In fact the silver nitrate seem to have made an excellent improvement in her wound last week. Fortunately there is no signs of active infection which is great news. No fevers, chills, nausea, vomiting, or diarrhea. 06/06/2020 upon evaluation today patient appears to be doing much better in regard to her wound. In fact the main area of the wound is completely healed the tunnel that was noted last week extending approximately at the 12:00 location is still open at  this time. I think this is can take some time to fill and to be honest it is not going to be an easy situation in general in my opinion. Nonetheless I  do think that the patient is otherwise doing quite well and overall I am very pleased with the progress that she has made. 06/27/2020 upon evaluation today patient actually appears to be doing excellent in fact she is completely healed based on what I am seeing at this point. There does not appear to be any signs of active infection at this time which is great news. No fevers, chills, nausea, vomiting, or diarrhea. Objective Constitutional Well-nourished and well-hydrated in no acute distress. Vitals Time Taken: 7:47 AM, Height: 69 in, Weight: 283 lbs, BMI: 41.8, Temperature: 98.5 F, Pulse: 76 bpm, Respiratory Rate: 18 breaths/min, Blood Pressure: 141/89 mmHg. Respiratory normal breathing without difficulty. Psychiatric this patient is able to make decisions and demonstrates good insight into disease process. Alert and Oriented x 3. pleasant and cooperative. General Notes: Patient's wound bed currently showed signs of good epithelization there is no signs of wound opening at this time and no drainage. Overall I feel like she has done quite well and I do not see any need for continuation of wound care therapy at this point. Integumentary (Hair, Skin) Wound #1 status is Open. Original cause of wound was Trauma. The wound is located on the Left,Anterior Lower Leg. The wound measures 0cm length x 0cm width x 0cm depth; 0cm^2 area and 0cm^3 volume. There is no tunneling or undermining noted. There is a none present amount of drainage noted. The wound margin is distinct with the outline attached to the wound base. There is no granulation within the wound bed. There is no necrotic tissue within the wound bed. Assessment Active Problems ICD-10 Venous insufficiency (chronic) (peripheral) Unspecified open wound, left lower leg, initial encounter Non-pressure chronic ulcer of other part of left lower leg with fat layer exposed Essential (primary) hypertension Plan Discharge From [facility]  Services: Discharge from [facility] Barriers/Peri-Wound Care: Moisturizing lotion - to legs daily Edema Control: Avoid standing for long periods of time Elevate legs to the level of the heart or above for 30 minutes daily and/or when sitting, a frequency of: - throughout the day Exercise regularly Support Garment 20-30 mm/Hg pressure to: - compression stocking to both legs daily 1. I Alexandra Petty suggest that we discontinue wound care services since the patient is completely healed. 2. I recommend that she continue to monitor for any signs of drainage or worsening in general I do not see anything right now that I think is good to be a complication or problem but obviously if she notices anything she should let me know as soon as possible. 3. She should continue to wear compression garments to both legs daily help keep edema under good control. We will see the patient back for follow-up visit as needed. Electronic Signature(s) Signed: 06/27/2020 9:06:47 AM By: Worthy Keeler PA-C Entered By: Worthy Keeler on 06/27/2020 09:06:47 -------------------------------------------------------------------------------- SuperBill Details Patient Name: Date of Service: Alexandra Kern MA NDA 06/27/2020 Medical Record Number: [id number] Patient Account Number: [id number] Date of Birth/Sex: Treating RN: [birth year] (39 y.o. [name] Primary Care Provider: [place] Billey Gosling RD Other Clinician: Referring Provider: Treating Provider/Extender: Worthy Keeler [place] Billey Gosling RD Weeks in Treatment: 7 Diagnosis Coding ICD-10 Codes Code Description I87.2 Venous insufficiency (chronic) (peripheral) S81.802A Unspecified open wound, left lower leg, initial encounter L97.822 Non-pressure  chronic ulcer of other part of left lower leg with fat layer exposed [place] (primary) hypertension Facility Procedures CPT4 Code: [id number] Description: 99213 - WOUND CARE VISIT-LEV 3 EST  PT Modifier: Quantity: 1 Physician Procedures : CPT4 Code Description Modifier [id number] - WC PHYS LEVEL 3 - EST PT ICD-10 Diagnosis Description I87.2 Venous insufficiency (chronic) (peripheral) S81.802A Unspecified open wound, left lower leg, initial encounter L97.822 Non-pressure chronic ulcer  of other part of left lower leg with fat layer exposed I10 Essential (primary) hypertension Quantity: 1 Electronic Signature(s) Signed: 06/27/2020 9:06:58 AM By: Worthy Keeler PA-C Entered By: Worthy Keeler on 06/27/2020 09:06:58

## 2020-06-28 NOTE — Progress Notes (Signed)
ZIENNA, AHLIN ([id number]) Visit Report for 06/27/2020 Arrival Information Details Patient Name: Date of Service: ALAIRA, LEVEL [place] NDA 06/27/2020 7:30 A M Medical Record Number: [id number] Patient Account Number: [id number] Date of Birth/Sex: Treating RN: 22-Dec-1980 (39 y.o. Nancy Fetter Primary Care Nigel Ericsson: [place] Billey Gosling RD Other Clinician: Referring Jeylin Woodmansee: Treating Larren Copes/Extender: Worthy Keeler [place] RRO Kristopher Oppenheim RD Weeks in Treatment: 7 Visit Information History Since Last Visit Added or deleted any medications: No Patient Arrived: Ambulatory Any new allergies or adverse reactions: No Arrival Time: 07:47 Had a fall or experienced change in No Accompanied By: alone activities of daily living that may affect Transfer Assistance: None risk of falls: Patient Identification Verified: Yes Signs or symptoms of abuse/neglect since last visito No Secondary Verification Process Completed: Yes Hospitalized since last visit: No Patient Has Alerts: Yes Implantable device outside of the clinic excluding No Patient Alerts: Left ABI: 0.99 cellular tissue based products placed in the center since last visit: Has Dressing in Place as Prescribed: Yes Pain Present Now: No Electronic Signature(s) Signed: 06/28/2020 5:02:14 PM By: Levan Hurst RN, BSN Entered By: Levan Hurst on 06/27/2020 07:47:32 -------------------------------------------------------------------------------- Clinic Level of Care Assessment Details Patient Name: Date of Service: [name] 06/27/2020 7:30 A M Medical Record Number: [id number] Patient Account Number: [id number] Date of Birth/Sex: Treating RN: 03-02-1981 (39 y.o. [name] Primary Care Velera [place]: [place] Billey Gosling RD Other Clinician: Referring Jose Alleyne: Treating Nancey Kreitz/Extender: Worthy Keeler [place] RRO Kristopher Oppenheim RD Weeks in Treatment: 7 Clinic Level of Care Assessment Items TOOL 4 Quantity Score []  - 0 Use when only an EandM is  performed on FOLLOW-UP visit ASSESSMENTS - Nursing Assessment / Reassessment X- 1 10 Reassessment of Co-morbidities (includes updates in patient status) X- 1 5 Reassessment of Adherence to Treatment Plan ASSESSMENTS - Wound and Skin A ssessment / Reassessment X - Simple Wound Assessment / Reassessment - one wound 1 5 []  - 0 Complex Wound Assessment / Reassessment - multiple wounds []  - 0 Dermatologic / Skin Assessment (not related to wound area) ASSESSMENTS - Focused Assessment []  - 0 Circumferential Edema Measurements - multi extremities []  - 0 Nutritional Assessment / Counseling / Intervention X- 1 5 Lower Extremity Assessment (monofilament, tuning fork, pulses) []  - 0 Peripheral Arterial Disease Assessment (using hand held doppler) ASSESSMENTS - Ostomy and/or Continence Assessment and Care []  - 0 Incontinence Assessment and Management []  - 0 Ostomy Care Assessment and Management (repouching, etc.) PROCESS - Coordination of Care X - Simple Patient / Family Education for ongoing care 1 15 []  - 0 Complex (extensive) Patient / Family Education for ongoing care X- 1 10 Staff obtains Programmer, systems, Records, T Results / Process Orders est []  - 0 Staff telephones HHA, Nursing Homes / Clarify orders / etc []  - 0 Routine Transfer to another Facility (non-emergent condition) []  - 0 Routine Hospital Admission (non-emergent condition) []  - 0 New Admissions / Biomedical engineer / Ordering NPWT Apligraf, etc. , []  - 0 Emergency Hospital Admission (emergent condition) X- 1 10 Simple Discharge Coordination []  - 0 Complex (extensive) Discharge Coordination PROCESS - Special Needs []  - 0 Pediatric / Minor Patient Management []  - 0 Isolation Patient Management []  - 0 Hearing / Language / Visual special needs []  - 0 Assessment of Community assistance (transportation, D/C planning, etc.) []  - 0 Additional assistance / Altered mentation []  - 0 Support Surface(s) Assessment  (bed, cushion, seat, etc.) INTERVENTIONS - Wound Cleansing / Measurement X - Simple Wound  Cleansing - one wound 1 5 []  - 0 Complex Wound Cleansing - multiple wounds X- 1 5 Wound Imaging (photographs - any number of wounds) []  - 0 Wound Tracing (instead of photographs) X- 1 5 Simple Wound Measurement - one wound []  - 0 Complex Wound Measurement - multiple wounds INTERVENTIONS - Wound Dressings []  - 0 Small Wound Dressing one or multiple wounds []  - 0 Medium Wound Dressing one or multiple wounds []  - 0 Large Wound Dressing one or multiple wounds []  - 0 Application of Medications - topical []  - 0 Application of Medications - injection INTERVENTIONS - Miscellaneous []  - 0 External ear exam []  - 0 Specimen Collection (cultures, biopsies, blood, body fluids, etc.) []  - 0 Specimen(s) / Culture(s) sent or taken to Lab for analysis []  - 0 Patient Transfer (multiple staff / Civil Service fast streamer / Similar devices) []  - 0 Simple Staple / Suture removal (25 or less) []  - 0 Complex Staple / Suture removal (26 or more) []  - 0 Hypo / Hyperglycemic Management (close monitor of Blood Glucose) []  - 0 Ankle / Brachial Index (ABI) - do not check if billed separately X- 1 5 Vital Signs Has the patient been seen at the hospital within the last three years: Yes Total Score: 80 Level Of Care: New/Established - Level 3 Electronic Signature(s) Signed: 06/28/2020 4:56:58 PM By: Baruch Gouty RN, BSN Entered By: Baruch Gouty on 06/27/2020 08:16:29 -------------------------------------------------------------------------------- Encounter Discharge Information Details Patient Name: Date of Service: [name] 06/27/2020 7:30 A M Medical Record Number: [id number] Patient Account Number: [id number] Date of Birth/Sex: Treating RN: April 05, 1981 (39 y.o. [name] Primary Care Hurshell Dino: [place] Billey Gosling RD Other Clinician: Referring Parisha Beaulac: Treating Lataja Newland/Extender: Albertine Grates RD Weeks in Treatment: 7 Encounter Discharge Information Items Discharge Condition: Stable Ambulatory Status: Ambulatory Discharge Destination: Home Transportation: Private Auto Accompanied By: self Schedule Follow-up Appointment: Yes Clinical Summary of Care: Patient Declined Electronic Signature(s) Signed: 06/28/2020 4:56:58 PM By: Baruch Gouty RN, BSN Entered By: Baruch Gouty on 06/27/2020 08:20:14 -------------------------------------------------------------------------------- Lower Extremity Assessment Details Patient Name: Date of Service: [name] 06/27/2020 7:30 A M Medical Record Number: [id number] Patient Account Number: [id number] Date of Birth/Sex: Treating RN: Jul 06, 1981 (39 y.o. Nancy Fetter Primary Care Cari Burgo: [place] Billey Gosling RD Other Clinician: Referring Jerris Keltz: Treating Alessia Gonsalez/Extender: Worthy Keeler [place] RRO Kristopher Oppenheim RD Weeks in Treatment: 7 Edema Assessment Assessed: [Left: No] [Right: No] Edema: [Left: Ye] [Right: s] Calf Left: Right: Point of Measurement: 30 cm From Medial Instep 43 cm cm Ankle Left: Right: Point of Measurement: 12 cm From Medial Instep 22 cm cm Vascular Assessment Pulses: Dorsalis Pedis Palpable: [Left:Yes] Electronic Signature(s) Signed: 06/28/2020 5:02:14 PM By: Levan Hurst RN, BSN Entered By: Levan Hurst on 06/27/2020 07:53:46 -------------------------------------------------------------------------------- [place] Details Patient Name: Date of Service: [name] 06/27/2020 7:30 A M Medical Record Number: [id number] Patient Account Number: [id number] Date of Birth/Sex: Treating RN: 1981/06/18 (39 y.o. [name] Primary Care Cheyann Blecha: [place] Billey Gosling RD Other Clinician: Referring Ryder Chesmore: Treating Serenity Batley/Extender: Albertine Grates RD Weeks in Treatment: 7 Active Inactive Electronic Signature(s) Signed: 06/28/2020 4:56:58 PM By:  Baruch Gouty RN, BSN Entered By: Baruch Gouty on 06/27/2020 08:04:12 -------------------------------------------------------------------------------- Pain Assessment Details Patient Name: Date of Service: [name] 06/27/2020 7:30 A M Medical Record Number: [id number] Patient Account Number: [id number] Date of Birth/Sex: Treating RN: [birth year] (39 y.o.  Nancy Fetter Primary Care Kamerin Grumbine: [place] Billey Gosling RD Other Clinician: Referring Lucee Brissett: Treating Brytnee Bechler/Extender: Worthy Keeler [place] Billey Gosling RD Weeks in Treatment: 7 Active Problems Location of Pain Severity and Description of Pain Patient Has Paino No Site Locations Pain Management and Medication Current Pain Management: Electronic Signature(s) Signed: 06/28/2020 5:02:14 PM By: Levan Hurst RN, BSN Entered By: Levan Hurst on 06/27/2020 07:47:45 -------------------------------------------------------------------------------- Patient/Caregiver Education Details Patient Name: Date of Service: [name] 7/21/2021andnbsp7:30 A M Medical Record Number: [id number] Patient Account Number: [id number] Date of Birth/Gender: Treating RN: Apr 27, 1981 (39 y.o. [name] Primary Care Physician: [place], [place] RD Other Clinician: Referring Physician: Treating Physician/Extender: Albertine Grates RD Weeks in Treatment: 7 Education Assessment Education Provided To: Patient Education Topics Provided Wound/Skin Impairment: Methods: Explain/Verbal Responses: Reinforcements needed, State content correctly Electronic Signature(s) Signed: 06/28/2020 4:56:58 PM By: Baruch Gouty RN, BSN Entered By: Baruch Gouty on 06/27/2020 08:03:59 -------------------------------------------------------------------------------- Wound Assessment Details Patient Name: Date of Service: [name] 06/27/2020 7:30 A M Medical Record Number: [id number] Patient Account Number:  [id number] Date of Birth/Sex: Treating RN: 1981/07/28 (39 y.o. Nancy Fetter Primary Care Pecola Haxton: [place] Billey Gosling RD Other Clinician: Referring Kiaira Pointer: Treating Tashina Credit/Extender: Worthy Keeler MO RRO Kristopher Oppenheim RD Weeks in Treatment: 7 Wound Status Wound Number: 1 Primary Etiology: Venous Leg Ulcer Wound Location: Left, Anterior Lower Leg Secondary Etiology: Trauma, Other Wounding Event: Trauma Wound Status: Open Date Acquired: 10/09/2019 Comorbid History: Hypertension Weeks Of Treatment: 7 Clustered Wound: No Photos Photo Uploaded By: Mikeal Hawthorne on 06/27/2020 12:38:54 Wound Measurements Length: (cm) Width: (cm) Depth: (cm) Area: (cm) Volume: (cm) 0 % Reduction in Area: 100% 0 % Reduction in Volume: 100% 0 Epithelialization: Large (67-100%) 0 Tunneling: No 0 Undermining: No Wound Description Classification: Full Thickness Without Exposed Support Structures Wound Margin: Distinct, outline attached Exudate Amount: None Present Foul Odor After Cleansing: No Slough/Fibrino No Wound Bed Granulation Amount: None Present (0%) Exposed Structure Necrotic Amount: None Present (0%) Fascia Exposed: No Fat Layer (Subcutaneous Tissue) Exposed: No Tendon Exposed: No Muscle Exposed: No Joint Exposed: No Bone Exposed: No Electronic Signature(s) Signed: 06/28/2020 5:02:14 PM By: Levan Hurst RN, BSN Entered By: Levan Hurst on 06/27/2020 07:53:14 -------------------------------------------------------------------------------- [place] Details Patient Name: Date of Service: [name] 06/27/2020 7:30 A M Medical Record Number: [id number] Patient Account Number: [id number] Date of Birth/Sex: Treating RN: [birth year] (39 y.o. Nancy Fetter Primary Care Madia Carvell: [place] Billey Gosling RD Other Clinician: Referring Creg Gilmer: Treating Kobie Whidby/Extender: Worthy Keeler MO RRO Kristopher Oppenheim RD Weeks in Treatment: 7 Vital Signs Time Taken: 07:47 Temperature (F):  98.5 Height (in): 69 Pulse (bpm): 76 Weight (lbs): 283 Respiratory Rate (breaths/min): 18 Body Mass Index (BMI): 41.8 Blood Pressure (mmHg): 141/89 Reference Range: 80 - 120 mg / dl Electronic Signature(s) Signed: 06/28/2020 5:02:14 PM By: Levan Hurst RN, BSN Entered By: Levan Hurst on 06/27/2020 07:50:03

## 2020-07-17 ENCOUNTER — Other Ambulatory Visit: Payer: Self-pay | Admitting: Registered Nurse

## 2020-07-17 DIAGNOSIS — F418 Other specified anxiety disorders: Secondary | ICD-10-CM

## 2020-07-18 ENCOUNTER — Other Ambulatory Visit: Payer: Self-pay | Admitting: Registered Nurse

## 2020-07-18 MED ORDER — BUPROPION HCL ER (SR) 100 MG PO TB12: 100.0000 mg | ORAL_TABLET | Freq: Two times a day (BID) | ORAL | 3 refills | Status: DC

## 2020-07-18 MED FILL — BUPROPION HCL SR 100 MG TAB: 100 | 90 days supply | Qty: 180 | Fill #0

## 2020-07-18 NOTE — Telephone Encounter (Signed)
Patient requesting refill Wellbutrin 100 mg, last OV/RF 05/01/2020 with you. Thank you.

## 2020-09-06 ENCOUNTER — Other Ambulatory Visit: Payer: Self-pay | Admitting: Registered Nurse

## 2020-09-06 DIAGNOSIS — I10 Essential (primary) hypertension: Secondary | ICD-10-CM

## 2020-09-06 MED ORDER — HYDROCHLOROTHIAZIDE 25 MG PO TABS: 25.0000 mg | ORAL_TABLET | Freq: Every day | ORAL | 0 refills | Status: DC

## 2020-09-06 MED FILL — HYDROCHLOROTHIAZIDE 25 MG T: 25 | 90 days supply | Qty: 90 | Fill #0

## 2020-09-06 NOTE — Telephone Encounter (Signed)
Medication Refill - Medication:hydrochlorothiazide (HYDRODIURIL) 25 MG tablet    Has the patient contacted their pharmacy? yes (Agent: If no, request that the patient contact the pharmacy for the refill.) (Agent: If yes, when and what did the pharmacy advise?)contact pcp  Preferred Pharmacy (with phone number or street name):  [facility], [place] Phone:  [phone]  Fax:  [phone]       Agent: Please be advised that RX refills may take up to 3 business days. We ask that you follow-up with your pharmacy.

## 2020-11-20 MED FILL — BUPROPION HCL SR 100 MG TAB: 100 | 90 days supply | Qty: 180 | Fill #1

## 2020-12-05 MED FILL — BUPROPION HCL SR 100 MG TAB: 100 | 90 days supply | Qty: 180 | Fill #1

## 2020-12-08 DIAGNOSIS — D229 Melanocytic nevi, unspecified: Secondary | ICD-10-CM

## 2020-12-08 HISTORY — DX: Melanocytic nevi, unspecified: D22.9

## 2020-12-19 ENCOUNTER — Other Ambulatory Visit: Payer: Self-pay | Admitting: Registered Nurse

## 2020-12-19 DIAGNOSIS — I10 Essential (primary) hypertension: Secondary | ICD-10-CM

## 2020-12-19 MED ORDER — HYDROCHLOROTHIAZIDE 25 MG PO TABS: 25.0000 mg | ORAL_TABLET | Freq: Every day | ORAL | 0 refills | Status: DC

## 2020-12-19 MED FILL — HYDROCHLOROTHIAZIDE 25 MG T: 25 | 90 days supply | Qty: 90 | Fill #0

## 2021-01-23 ENCOUNTER — Encounter: Payer: Self-pay | Admitting: Registered Nurse

## 2021-01-30 ENCOUNTER — Encounter: Payer: Self-pay | Admitting: Registered Nurse

## 2021-01-30 ENCOUNTER — Ambulatory Visit: Payer: 59 | Admitting: Registered Nurse

## 2021-01-30 ENCOUNTER — Other Ambulatory Visit: Payer: Self-pay

## 2021-01-30 VITALS — BP 140/90 | HR 88 | Temp 98.0°F | Resp 18 | Ht 64.0 in | Wt 275.4 lb

## 2021-01-30 DIAGNOSIS — L723 Sebaceous cyst: Secondary | ICD-10-CM

## 2021-01-30 DIAGNOSIS — I10 Essential (primary) hypertension: Secondary | ICD-10-CM

## 2021-01-30 DIAGNOSIS — R000 Tachycardia, unspecified: Secondary | ICD-10-CM | POA: Diagnosis not present

## 2021-01-30 NOTE — Progress Notes (Signed)
Established Patient Office Visit  Subjective:  Patient ID: Alexandra Petty, female    DOB: [birth year]  Age: 40 y.o. MRN: [id number]  CC:  Chief Complaint  Patient presents with  . Referral    Patient states she is here for a referral to the cardiologist. Per patient she also wants to discuss a cyst on right shoulder.    HPI Alexandra Petty presents for cardiac concerns and cyst  Cardiac concerns: Recently she unfortunately lost her father in law and heart disease may have been a contributing factor. Her own father passed in his 81s from an MI and heart disease though lifestyle factors were a confounding variable for him. She has been concerned that her blood pressure and heart rate has been somewhat labile - has been wearing apple watch since Christmas and notes some concerning numbers. BP wnl today. Taking hctz 25mg  PO qd with good effect. No acute concerns but would prefer to est with cardiology to discuss need for echo and stress test.  Cyst: Upper right side of back. Soft, round, no head/drainage. Has been there 2+ years but growing more over the past 1-2 months. Not painful or red, no apparent infection. Has not happened before. Does not see dermatology routinely but would like to establish with one. No systemic symptoms or further concerns.   Past Medical History:  Diagnosis Date  . Anxiety   . Depression    Phreesia 05/01/2020  . GERD (gastroesophageal reflux disease)     Past Surgical History:  Procedure Laterality Date  . APPENDECTOMY    . GASTRIC BYPASS    . 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 HIATAL HERNIA REPAIR N/A 01/01/2015   Procedure: LAPAROSCOPIC REPAIR OF HIATAL HERNIA;  Surgeon: Pedro Earls, MD;  Location: [facility];  Service: General;  Laterality: N/A;    Family History  Problem Relation Age of  Onset  . Arthritis Mother   . Alcohol abuse Father   . Diabetes Father   . Heart disease Father   . Hyperlipidemia Father   . Hypertension Father     Social History   Socioeconomic History  . Marital status: Married    Spouse name: Not on file  . Number of children: Not on file  . Years of education: 12+  . Highest education level: Not on file  Occupational History  . Occupation: Aeronautical engineer: [employer]  Tobacco Use  . Smoking status: Never Smoker  . Smokeless tobacco: Never Used  Substance and Sexual Activity  . Alcohol use: Yes  . Drug use: No  . Sexual activity: Not on file  Other Topics Concern  . Not on file  Social History Narrative  . Not on file   Social Determinants of Health   Financial Resource Strain: Not on file  Food Insecurity: Not on file  Transportation Needs: Not on file  Physical Activity: Not on file  Stress: Not on file  Social Connections: Not on file  Intimate Partner Violence: Not on file    Outpatient Medications Prior to Visit  Medication Sig Dispense Refill  . buPROPion (WELLBUTRIN SR) 100 MG 12 hr tablet Take 1 tablet (100 mg total) by mouth 2 (two) times daily. 180 tablet 3  . hydrochlorothiazide (HYDRODIURIL) 25 MG tablet Take 1 tablet (25 mg total) by mouth daily. TAKE 1 TABLET(25  MG) BY MOUTH DAILY 90 tablet 0  . mupirocin ointment (BACTROBAN) 2 % Place 1 application into the nose 2 (two) times daily. 22 g 0  . ciprofloxacin (CIPRO) 750 MG tablet Take 1 tablet (750 mg total) by mouth 2 (two) times daily. (Patient not taking: No sig reported) 10 tablet 0  . sulfamethoxazole-trimethoprim (BACTRIM) 400-80 MG tablet Take 1 tablet by mouth 2 (two) times daily. (Patient not taking: No sig reported) 14 tablet 0   No facility-administered medications prior to visit.    No Known Allergies  ROS Review of Systems  Constitutional: Negative.   HENT: Negative.   Eyes: Negative.   Respiratory: Negative.   Cardiovascular:  Positive for palpitations. Negative for chest pain and leg swelling.  Gastrointestinal: Negative.   Genitourinary: Negative.   Musculoskeletal: Negative.   Skin: Negative.   Neurological: Negative.   Psychiatric/Behavioral: Negative.   All other systems reviewed and are negative.     Objective:    Physical Exam Vitals and nursing note reviewed.  Constitutional:      General: She is not in acute distress.    Appearance: Normal appearance. She is normal weight. She is not ill-appearing, toxic-appearing or diaphoretic.  Cardiovascular:     Rate and Rhythm: Normal rate and regular rhythm.     Heart sounds: Normal heart sounds. No murmur heard. No friction rub. No gallop.   Pulmonary:     Effort: Pulmonary effort is normal. No respiratory distress.     Breath sounds: Normal breath sounds. No stridor. No wheezing, rhonchi or rales.  Chest:     Chest wall: No tenderness.  Skin:    General: Skin is warm and dry.     Findings: Lesion (sebaceous cyst upper right back) present.  Neurological:     General: No focal deficit present.     Mental Status: She is alert and oriented to person, place, and time. Mental status is at baseline.  Psychiatric:        Mood and Affect: Mood normal.        Behavior: Behavior normal.        Thought Content: Thought content normal.        Judgment: Judgment normal.     BP 140/90   Pulse 88   Temp 98 F (36.7 C) (Temporal)   Resp 18   Ht 5\' 4"  (1.626 m)   Wt 275 lb 6.4 oz (124.9 kg)   SpO2 97%   BMI 47.27 kg/m  Wt Readings from Last 3 Encounters:  01/30/21 275 lb 6.4 oz (124.9 kg)  05/28/20 284 lb 1.6 oz (128.9 kg)  05/01/20 284 lb 3.2 oz (128.9 kg)     Health Maintenance Due  Topic Date Due  . MAMMOGRAM  10/11/2020    There are no preventive care reminders to display for this patient.  Lab Results  Component Value Date   TSH 2.060 04/20/2020   Lab Results  Component Value Date   WBC 8.2 04/20/2020   HGB 12.1 04/20/2020   HCT  38.6 04/20/2020   MCV 86 04/20/2020   PLT 459 (H) 04/20/2020   Lab Results  Component Value Date   NA 138 04/20/2020   K 4.8 04/20/2020   CO2 23 04/20/2020   GLUCOSE 76 04/20/2020   BUN 9 04/20/2020   CREATININE 0.75 04/20/2020   BILITOT 0.2 04/20/2020   ALKPHOS 145 (H) 04/20/2020   AST 17 04/20/2020   ALT 14 04/20/2020   PROT 7.1 04/20/2020  ALBUMIN 4.1 04/20/2020   CALCIUM 8.8 04/20/2020   ANIONGAP 7 12/27/2014   Lab Results  Component Value Date   CHOL 195 04/20/2020   Lab Results  Component Value Date   HDL 61 04/20/2020   Lab Results  Component Value Date   LDLCALC 97 04/20/2020   Lab Results  Component Value Date   TRIG 218 (H) 04/20/2020   Lab Results  Component Value Date   CHOLHDL 3.2 04/20/2020   Lab Results  Component Value Date   HGBA1C 5.2 04/20/2020      Assessment & Plan:   Problem List Items Addressed This Visit   None   Visit Diagnoses    Sebaceous cyst    -  Primary   Relevant Orders   Ambulatory referral to Dermatology   Essential hypertension       Relevant Orders   Ambulatory referral to Cardiology   Rapid heart rate       Relevant Orders   Ambulatory referral to Cardiology      No orders of the defined types were placed in this encounter.   Follow-up: No follow-ups on file.   PLAN  No acute concerns for CV today. bp borderline but wnl. Will refer to cardiology as she may benefit from stress test to elicit changes. Last lipid panel shows no obvious concerns.  Sebaceous cyst able to be drained with small puncture of 18g needle. Copious amounts of curd like sebum drained with pressure. Scant blood. Applied steri strips, intent to heal by secondary intention. Reviewed sxs of infection. Follow up with dermatology if recurrent.  Patient encouraged to call clinic with any questions, comments, or concerns.  I spent 41 minutes with this patient, more than 50% of which was spent counseling and/or educating.  Maximiano Coss,  NP

## 2021-01-30 NOTE — Patient Instructions (Signed)
° ° ° °  If you have lab work done today you will be contacted with your lab results within the next 2 weeks.  If you have not heard from us then please contact us. The fastest way to get your results is to register for My Chart. ° ° °IF you received an x-ray today, you will receive an invoice from [facility]. Please contact [facility] at [phone] with questions or concerns regarding your invoice.  ° °IF you received labwork today, you will receive an invoice from [facility]. Please contact [facility] at [phone] with questions or concerns regarding your invoice.  ° °Our billing staff will not be able to assist you with questions regarding bills from these companies. ° °You will be contacted with the lab results as soon as they are available. The fastest way to get your results is to activate your My Chart account. Instructions are located on the last page of this paperwork. If you have not heard from us regarding the results in 2 weeks, please contact this office. °  ° ° ° °

## 2021-03-08 NOTE — Progress Notes (Signed)
Patient referred by Maximiano Coss, NP for hypertension, family history of early CAD  Subjective:   Alexandra Petty, female    DOB: Dec 23, 1980, 40 y.o.   MRN: [id number]   Chief Complaint  Patient presents with  . Hypertension  . Coronary Artery Disease  . New Patient (Initial Visit)     HPI  40 y.o. 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  Work is busy with long hours and stress.  Gained about 60 pounds at the beginning of the pandemic.  This also correlated with increasing her resting blood pressure.  Over the last few months, she is made significant changes to her lifestyle.  She tries to walk 10,000 steps every day.  She eats primarily fish-based diet, does not any excess salt and avoids other salt heavy food.  She does drink 4 to 5 cups of coffee every day, along with an occasional cup of black tea every now and then.  She does not drink alcohol.  Blood pressure is elevated today.  She is currently on hydrochlorothiazide 25 mg daily.  She denies any chest pain or shortness of breath symptoms.  Her biological father had a fatal MI at age 48.  Her biological paternal uncle also had early coronary artery disease.    Past Medical History:  Diagnosis Date  . Anxiety   . Depression    Phreesia 05/01/2020  . GERD (gastroesophageal reflux disease)      Past Surgical History:  Procedure Laterality Date  . APPENDECTOMY    . GASTRIC BYPASS    . 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 HIATAL HERNIA REPAIR N/A 01/01/2015   Procedure: LAPAROSCOPIC REPAIR OF HIATAL HERNIA;  Surgeon: Pedro Earls, MD;  Location: [facility];  Service: General;  Laterality: N/A;     Social History   Tobacco Use  Smoking Status  Never Smoker  Smokeless Tobacco Never Used    Social History   Substance and Sexual Activity  Alcohol Use Yes     Family History  Problem Relation Age of Onset  . Arthritis Mother   . Alcohol abuse Father   . Diabetes Father   . Heart disease Father   . Hyperlipidemia Father   . Hypertension Father      Current Outpatient Medications on File Prior to Visit  Medication Sig Dispense Refill  . buPROPion (WELLBUTRIN SR) 100 MG 12 hr tablet TAKE 1 TABLET BY MOUTH TWO TIMES DAILY 180 tablet 3  . hydrochlorothiazide (HYDRODIURIL) 25 MG tablet TAKE 1 TABLET BY MOUTH ONCE A DAY 90 tablet 0   No current facility-administered medications on file prior to visit.    Cardiovascular and other pertinent studies:  EKG 03/11/2021: Sinus rhythm 90 bpm  Left atrial enlargement   Recent labs: 04/20/2020: Glucose 76, BUN/Cr 9/0.75. EGFR 100. Na/K 138/4.8. Rest of the CMP normal H/H 12.1/38.6. MCV 86. Platelets 459 HbA1C 5.2% Chol 195, TG 218, HDL 61, LDL 97 TSH 2.0 normal    Review of Systems  Cardiovascular: Negative for chest pain, dyspnea on exertion, leg swelling, palpitations and syncope.         Vitals:   03/11/21 0942  BP: (!) 57/37  Pulse: (!) 54  SpO2: 97%     Body mass  index is 40.17 kg/m. Filed Weights   03/11/21 0942  Weight: 272 lb (123.4 kg)     Objective:   Physical Exam Vitals and nursing note reviewed.  Constitutional:      General: She is not in acute distress.    Appearance: She is obese.  Neck:     Vascular: No JVD.  Cardiovascular:     Rate and Rhythm: Normal rate and regular rhythm.     Heart sounds: Normal heart sounds. No murmur heard.   Pulmonary:     Effort: Pulmonary effort is normal.     Breath sounds: Normal breath sounds. No wheezing or rales.         Assessment & Recommendations:   40 y.o. Caucasian female with hypertension, family h/o early CAD  Hypertension: Probably primary.  Normal TSH.  Obesity likely an  underlying factor.  Given her irregular menses, I encouraged her to discuss with her OB/GYN regarding possibility of PCOS.  I encouraged to continue her excellent diet and exercise modification, including regular exercise and low-salt diet.  I have encouraged her to reduce caffeine intake.  I have switched her hydrochlorothiazide 25 mg daily to valsartan-hydrochlorothiazide 320-25 mg daily.  Check BMP in a week. Will obtain echocardiogram.  Screening for CAD: Strong family history of early coronary artery disease.  Recommend CT cardiac scoring for stratification.  Further recommendations after above testing.    Nigel Mormon, MD Pager: [phone] Office: [phone]

## 2021-03-11 ENCOUNTER — Other Ambulatory Visit (HOSPITAL_COMMUNITY): Payer: Self-pay

## 2021-03-11 ENCOUNTER — Encounter: Payer: Self-pay | Admitting: Cardiology

## 2021-03-11 ENCOUNTER — Ambulatory Visit: Payer: 59 | Admitting: Cardiology

## 2021-03-11 ENCOUNTER — Other Ambulatory Visit: Payer: Self-pay

## 2021-03-11 VITALS — BP 182/109 | HR 92 | Temp 98.2°F | Ht 69.0 in | Wt 272.0 lb

## 2021-03-11 DIAGNOSIS — Z8249 Family history of ischemic heart disease and other diseases of the circulatory system: Secondary | ICD-10-CM | POA: Insufficient documentation

## 2021-03-11 DIAGNOSIS — I10 Essential (primary) hypertension: Secondary | ICD-10-CM | POA: Insufficient documentation

## 2021-03-11 MED ORDER — VALSARTAN-HYDROCHLOROTHIAZIDE 320-25 MG PO TABS
1.0000 | ORAL_TABLET | Freq: Every day | ORAL | 2 refills | Status: DC
  Filled 2021-03-11: qty 30, 30d supply, fill #0
  Filled 2021-04-17: qty 30, 30d supply, fill #1

## 2021-03-13 ENCOUNTER — Ambulatory Visit: Payer: Self-pay | Admitting: Cardiology

## 2021-03-21 ENCOUNTER — Other Ambulatory Visit: Payer: Self-pay

## 2021-03-21 DIAGNOSIS — I10 Essential (primary) hypertension: Secondary | ICD-10-CM

## 2021-03-25 ENCOUNTER — Other Ambulatory Visit: Payer: Self-pay

## 2021-03-25 ENCOUNTER — Ambulatory Visit: Payer: 59

## 2021-03-25 DIAGNOSIS — I10 Essential (primary) hypertension: Secondary | ICD-10-CM

## 2021-04-04 ENCOUNTER — Other Ambulatory Visit (HOSPITAL_COMMUNITY): Payer: Self-pay

## 2021-04-04 MED FILL — Bupropion HCl Tab ER 12HR 100 MG: ORAL | 90 days supply | Qty: 180 | Fill #0 | Status: CN

## 2021-04-11 ENCOUNTER — Other Ambulatory Visit (HOSPITAL_COMMUNITY): Payer: Self-pay

## 2021-04-12 ENCOUNTER — Other Ambulatory Visit (HOSPITAL_COMMUNITY): Payer: Self-pay

## 2021-04-12 MED FILL — Bupropion HCl Tab ER 12HR 100 MG: ORAL | 90 days supply | Qty: 180 | Fill #0 | Status: AC

## 2021-04-17 ENCOUNTER — Other Ambulatory Visit (HOSPITAL_COMMUNITY): Payer: Self-pay

## 2021-04-24 ENCOUNTER — Encounter: Payer: Self-pay | Admitting: Cardiology

## 2021-04-24 ENCOUNTER — Ambulatory Visit: Payer: 59 | Admitting: Cardiology

## 2021-04-24 ENCOUNTER — Other Ambulatory Visit: Payer: Self-pay

## 2021-04-24 VITALS — BP 139/81 | HR 97 | Temp 98.4°F | Resp 16 | Ht 69.0 in | Wt 274.2 lb

## 2021-04-24 DIAGNOSIS — I10 Essential (primary) hypertension: Secondary | ICD-10-CM | POA: Diagnosis not present

## 2021-04-24 MED ORDER — VALSARTAN-HYDROCHLOROTHIAZIDE 320-25 MG PO TABS
1.0000 | ORAL_TABLET | Freq: Every day | ORAL | 3 refills | Status: DC
Start: 2021-04-24 — End: 2021-10-23
  Filled 2021-04-24 – 2021-05-22 (×2): qty 90, 90d supply, fill #0
  Filled 2021-08-28: qty 90, 90d supply, fill #1

## 2021-04-24 NOTE — Progress Notes (Signed)
Patient referred by Maximiano Coss, NP for hypertension, family history of early CAD  Subjective:   Alexandra Petty, female    DOB: 1981/09/12, 40 y.o.   MRN: [id number]   Chief Complaint  Patient presents with  . Family history of early CAD  . Follow-up     HPI  40 y.o. Caucasian female with hypertension, family h/o early CAD  Patient is tolerating valsartan-hydrochlorothiazide well.  She has noticed increase frequency of urination, but has noticed improvement in her blood pressure.  Reviewed recent CT cardiac scoring, echocardiogram results with the patient, details below.  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  Work is busy with long hours and stress.  Gained about 60 pounds at the beginning of the pandemic.  This also correlated with increasing her resting blood pressure.  Over the last few months, she is made significant changes to her lifestyle.  She tries to walk 10,000 steps every day.  She eats primarily fish-based diet, does not any excess salt and avoids other salt heavy food.  She does drink 4 to 5 cups of coffee every day, along with an occasional cup of black tea every now and then.  She does not drink alcohol.  Blood pressure is elevated today.  She is currently on hydrochlorothiazide 25 mg daily.  She denies any chest pain or shortness of breath symptoms.  Her biological father had a fatal MI at age 50.  Her biological paternal uncle also had early coronary artery disease.   Current Outpatient Medications on File Prior to Visit  Medication Sig Dispense Refill  . buPROPion (WELLBUTRIN SR) 100 MG 12 hr tablet TAKE 1 TABLET BY MOUTH TWO TIMES DAILY 180 tablet 3  . valsartan-hydrochlorothiazide (DIOVAN-HCT) 320-25 MG tablet Take 1 tablet by mouth daily. 30 tablet 2   No current facility-administered medications on file prior to visit.    Cardiovascular and other pertinent  studies:  Echocardiogram 03/25/2021:  Left ventricle cavity is normal in size and wall thickness. Normal global  wall motion. Normal LV systolic function with EF 71%. Normal diastolic  filling pattern.  No signifiant valvular abnormality.  Normal right atrial pressure.   CT Cardiac scoring 03/20/2021: Calcium score: 0 No abnormaliies   EKG 03/11/2021: Sinus rhythm 90 bpm  Left atrial enlargement   Recent labs: 04/20/2020: Glucose 76, BUN/Cr 9/0.75. EGFR 100. Na/K 138/4.8. Rest of the CMP normal H/H 12.1/38.6. MCV 86. Platelets 459 HbA1C 5.2% Chol 195, TG 218, HDL 61, LDL 97 TSH 2.0 normal    Review of Systems  Cardiovascular: Negative for chest pain, dyspnea on exertion, leg swelling, palpitations and syncope.        Vitals:   04/24/21 1126  BP: 139/81  Pulse: 97  Resp: 16  Temp: 98.4 F (36.9 C)  SpO2: 98%     Body mass index is 40.49 kg/m. Filed Weights   04/24/21 1126  Weight: 274 lb 3.2 oz (124.4 kg)     Objective:   Physical Exam Vitals and nursing note reviewed.  Constitutional:      General: She is not in acute distress.    Appearance: She is obese.  Neck:     Vascular: No JVD.  Cardiovascular:     Rate and Rhythm: Normal rate and regular rhythm.     Heart sounds: Normal heart sounds. No murmur heard.   Pulmonary:     Effort: Pulmonary effort is normal.  Breath sounds: Normal breath sounds. No wheezing or rales.         Assessment & Recommendations:   40 y.o. Caucasian female with hypertension, family h/o early CAD  Hypertension: Probably primary.  Normal TSH.  Obesity likely an underlying factor.  Given her irregular menses, I encouraged her to discuss with her OB/GYN regarding possibility of PCOS.  I encouraged to continue her excellent diet and exercise modification, including regular exercise and low-salt diet.  I congratulated her on her reduced caffeine intake.   Continue valsartan-hydrochlorothiazide 320-25 mg daily.  BMP  pending.  Screening for CAD: Strong family history of early coronary artery disease.   Fortunately, calcium score 0.  Lipids are reasonable.  Indication for statin at this time.    F/u in 6 months   Nigel Mormon, MD Pager: [phone] Office: [phone]

## 2021-04-25 ENCOUNTER — Other Ambulatory Visit (HOSPITAL_COMMUNITY): Payer: Self-pay

## 2021-05-08 DIAGNOSIS — I10 Essential (primary) hypertension: Secondary | ICD-10-CM | POA: Diagnosis not present

## 2021-05-09 LAB — BASIC METABOLIC PANEL
BUN/Creatinine Ratio: 11 (ref 9–23)
BUN: 8 mg/dL (ref 6–24)
CO2: 22 mmol/L (ref 20–29)
Calcium: 9.1 mg/dL (ref 8.7–10.2)
Chloride: 101 mmol/L (ref 96–106)
Creatinine, Ser: 0.74 mg/dL (ref 0.57–1.00)
Glucose: 84 mg/dL (ref 65–99)
Potassium: 4.6 mmol/L (ref 3.5–5.2)
Sodium: 138 mmol/L (ref 134–144)
eGFR: 104 mL/min/{1.73_m2} (ref >59)

## 2021-05-22 ENCOUNTER — Other Ambulatory Visit (HOSPITAL_COMMUNITY): Payer: Self-pay

## 2021-07-02 ENCOUNTER — Other Ambulatory Visit: Payer: Self-pay

## 2021-07-02 ENCOUNTER — Ambulatory Visit: Payer: 59 | Admitting: Physician Assistant

## 2021-07-02 ENCOUNTER — Encounter: Payer: Self-pay | Admitting: Physician Assistant

## 2021-07-02 DIAGNOSIS — Z1283 Encounter for screening for malignant neoplasm of skin: Secondary | ICD-10-CM | POA: Diagnosis not present

## 2021-07-02 DIAGNOSIS — L720 Epidermal cyst: Secondary | ICD-10-CM | POA: Diagnosis not present

## 2021-07-02 DIAGNOSIS — D485 Neoplasm of uncertain behavior of skin: Secondary | ICD-10-CM

## 2021-07-02 DIAGNOSIS — D2262 Melanocytic nevi of left upper limb, including shoulder: Secondary | ICD-10-CM

## 2021-07-02 DIAGNOSIS — D235 Other benign neoplasm of skin of trunk: Secondary | ICD-10-CM | POA: Diagnosis not present

## 2021-07-02 DIAGNOSIS — D2260 Melanocytic nevi of unspecified upper limb, including shoulder: Secondary | ICD-10-CM | POA: Diagnosis not present

## 2021-07-02 NOTE — Patient Instructions (Signed)

## 2021-07-02 NOTE — Progress Notes (Signed)
   New Patient   Subjective  Alexandra Petty is a 40 y.o. female who presents for the following: Annual Exam (Cyst right post shoulder not active drained by pcp patient aware surgery will need to be made, left upper arm and mid back dark spots  ).Upper back lesion that stings and bleeds x 2 years.   The following portions of the chart were reviewed this encounter and updated as appropriate:  Tobacco  Allergies  Meds  Problems  Med Hx  Surg Hx  Fam Hx      Objective  Well appearing patient in no apparent distress; mood and affect are within normal limits.  All skin waist up examined.  Mid Upper Back Scaly erythematous macule        Left Upper Arm Bichromic dark nested macule.        Right Upper Back Large comedome surrounded by erythema.  Assessment & Plan  Neoplasm of uncertain behavior of skin (2) Mid Upper Back  Skin / nail biopsy Type of biopsy: tangential   Informed consent: discussed and consent obtained   Timeout: patient name, date of birth, surgical site, and procedure verified   Procedure prep:  Patient was prepped and draped in usual sterile fashion (Non sterile) Prep type:  Chlorhexidine Anesthesia: the lesion was anesthetized in a standard fashion   Anesthetic:  1% lidocaine w/ epinephrine 1-100,000 local infiltration Instrument used: flexible razor blade   Hemostasis achieved with: aluminum chloride and electrodesiccation   Outcome: patient tolerated procedure well   Post-procedure details: sterile dressing applied and wound care instructions given   Dressing type: bandage and petrolatum    Specimen 1 - Surgical pathology Differential Diagnosis: R/O BCC vs SCC - cautery after biopsy  Check Margins: Yes  Left Upper Arm  Skin / nail biopsy Type of biopsy: tangential   Informed consent: discussed and consent obtained   Timeout: patient name, date of birth, surgical site, and procedure verified   Procedure prep:  Patient was prepped and  draped in usual sterile fashion (Non sterile) Prep type:  Chlorhexidine Anesthesia: the lesion was anesthetized in a standard fashion   Anesthetic:  1% lidocaine w/ epinephrine 1-100,000 local infiltration Instrument used: flexible razor blade   Hemostasis achieved with: aluminum chloride   Outcome: patient tolerated procedure well   Post-procedure details: sterile dressing applied and wound care instructions given   Dressing type: bandage and petrolatum    Specimen 2 - Surgical pathology Differential Diagnosis: R/O Atypia  Check Margins: Yes  Epidermal cyst Right Upper Back  30 minute surgery    I, Osmar Howton, PA-C, have reviewed all documentation's for this visit.  The documentation on 07/02/21 for the exam, diagnosis, procedures and orders are all accurate and complete.

## 2021-07-09 MED FILL — Bupropion HCl Tab ER 12HR 100 MG: ORAL | 90 days supply | Qty: 180 | Fill #1 | Status: CN

## 2021-07-10 ENCOUNTER — Telehealth: Payer: Self-pay

## 2021-07-10 ENCOUNTER — Other Ambulatory Visit (HOSPITAL_COMMUNITY): Payer: Self-pay

## 2021-07-10 NOTE — Telephone Encounter (Signed)
-----   Message from Warren Danes, [place] sent at 07/09/2021  6:05 PM EDT ----- RTC if recurs

## 2021-07-10 NOTE — Telephone Encounter (Signed)
Phone call to patient with her pathology results. Voicemail left for patient to give the office a call back.  

## 2021-07-18 ENCOUNTER — Other Ambulatory Visit (HOSPITAL_COMMUNITY): Payer: Self-pay

## 2021-07-19 ENCOUNTER — Other Ambulatory Visit (HOSPITAL_COMMUNITY): Payer: Self-pay

## 2021-07-25 ENCOUNTER — Other Ambulatory Visit: Payer: Self-pay | Admitting: Registered Nurse

## 2021-07-25 ENCOUNTER — Other Ambulatory Visit (HOSPITAL_COMMUNITY): Payer: Self-pay

## 2021-07-25 DIAGNOSIS — F418 Other specified anxiety disorders: Secondary | ICD-10-CM

## 2021-07-25 MED ORDER — BUPROPION HCL ER (SR) 100 MG PO TB12
100.0000 mg | ORAL_TABLET | Freq: Two times a day (BID) | ORAL | 0 refills | Status: DC
  Filled 2021-07-25: qty 180, 90d supply, fill #0

## 2021-08-28 ENCOUNTER — Other Ambulatory Visit (HOSPITAL_COMMUNITY): Payer: Self-pay

## 2021-10-23 ENCOUNTER — Other Ambulatory Visit (HOSPITAL_COMMUNITY): Payer: Self-pay

## 2021-10-23 ENCOUNTER — Ambulatory Visit: Payer: 59 | Admitting: Cardiology

## 2021-10-23 ENCOUNTER — Encounter: Payer: Self-pay | Admitting: Cardiology

## 2021-10-23 ENCOUNTER — Other Ambulatory Visit: Payer: Self-pay

## 2021-10-23 VITALS — BP 135/95 | HR 85 | Temp 98.0°F | Resp 16 | Ht 69.0 in | Wt 268.0 lb

## 2021-10-23 DIAGNOSIS — Z8249 Family history of ischemic heart disease and other diseases of the circulatory system: Secondary | ICD-10-CM

## 2021-10-23 DIAGNOSIS — I10 Essential (primary) hypertension: Secondary | ICD-10-CM | POA: Diagnosis not present

## 2021-10-23 MED ORDER — VALSARTAN-HYDROCHLOROTHIAZIDE 320-25 MG PO TABS
1.0000 | ORAL_TABLET | Freq: Every day | ORAL | 3 refills | Status: DC
  Filled 2021-10-23 – 2021-11-29 (×2): qty 90, 90d supply, fill #0
  Filled 2022-03-04: qty 90, 90d supply, fill #1
  Filled 2022-06-02: qty 90, 90d supply, fill #2
  Filled 2022-09-05: qty 90, 90d supply, fill #3

## 2021-10-23 NOTE — Progress Notes (Signed)
Patient referred by Maximiano Coss, NP for hypertension, family history of early CAD  Subjective:   Alexandra Petty, female    DOB: [birth year], 40 y.o.   MRN: [id number]   Chief Complaint  Patient presents with   Hypertension   Follow-up    6 month     HPI  40 y.o. Caucasian female with hypertension, family h/o early CAD  Patient is doing well, denies chest pain, shortness of breath, palpitations, leg edema, orthopnea, PND, TIA/syncope. Blood pressure elevated today, but generally well controlled at home.   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  Work is busy with long hours and stress.  Gained about 60 pounds at the beginning of the pandemic.  This also correlated with increasing her resting blood pressure.  Over the last few months, she is made significant changes to her lifestyle.  She tries to walk 10,000 steps every day.  She eats primarily fish-based diet, does not any excess salt and avoids other salt heavy food.  She does drink 4 to 5 cups of coffee every day, along with an occasional cup of black tea every now and then.  She does not drink alcohol.  Blood pressure is elevated today.  She is currently on hydrochlorothiazide 25 mg daily.  She denies any chest pain or shortness of breath symptoms.  Her biological father had a fatal MI at age 71.  Her biological paternal uncle also had early coronary artery disease.   Current Outpatient Medications on File Prior to Visit  Medication Sig Dispense Refill   buPROPion ER (WELLBUTRIN SR) 100 MG 12 hr tablet Take 1 tablet (100 mg total) by mouth 2 (two) times daily. 180 tablet 0   valsartan-hydrochlorothiazide (DIOVAN-HCT) 320-25 MG tablet Take 1 tablet by mouth daily. 90 tablet 3   No current facility-administered medications on file prior to visit.    Cardiovascular and other pertinent studies:  EKG 10/23/2021: Sinus rhythm 80 bpm   Normal EKG  Echocardiogram 03/25/2021:  Left ventricle cavity is normal in size and wall thickness. Normal global  wall motion. Normal LV systolic function with EF 71%. Normal diastolic  filling pattern.  No signifiant valvular abnormality.  Normal right atrial pressure.   CT Cardiac scoring 03/20/2021: Calcium score: 0 No abnormaliies  Recent labs: 05/08/2021: Glucose 84, BUN/Cr 8/0.74. EGFR 104. Na/K 138/4.6.   04/20/2020: Glucose 76, BUN/Cr 9/0.75. EGFR 100. Na/K 138/4.8. Rest of the CMP normal H/H 12.1/38.6. MCV 86. Platelets 459 HbA1C 5.2% Chol 195, TG 218, HDL 61, LDL 97 TSH 2.0 normal    Review of Systems  Cardiovascular:  Negative for chest pain, dyspnea on exertion, leg swelling, palpitations and syncope.       Vitals:   10/23/21 1200  BP: (!) 155/88  Pulse: 87  Resp: 16  Temp: 98 F (36.7 C)  SpO2: 98%     Body mass index is 39.58 kg/m. Filed Weights   10/23/21 1200  Weight: 268 lb (121.6 kg)     Objective:   Physical Exam Vitals and nursing note reviewed.  Constitutional:      General: She is not in acute distress.    Appearance: She is obese.  Neck:     Vascular: No JVD.  Cardiovascular:     Rate and Rhythm: Normal rate and regular rhythm.     Heart sounds: Normal heart sounds. No murmur heard. Pulmonary:     Effort: Pulmonary effort is  normal.     Breath sounds: Normal breath sounds. No wheezing or rales.        Assessment & Recommendations:   40 y.o. Caucasian female with hypertension, family h/o early CAD  Hypertension: Probably primary.  Normal TSH.  Obesity likely an underlying factor.  Given her irregular menses, I encouraged her to discuss with her OB/GYN regarding possibility of PCOS.  I encouraged to continue her excellent diet and exercise modification, including regular exercise and low-salt diet.  Currently on valsartan-hydrochlorothiazide 320-25 mg daily.   Continue the same.  Screening for CAD: Strong family history  of early coronary artery disease.   Fortunately, calcium score 0.  Lipids are reasonable.  No clear indication for statin at this time.    F/u in 6 months with repeat BMP   Nigel Mormon, MD Pager: [phone] Office: [phone]

## 2021-11-13 ENCOUNTER — Other Ambulatory Visit: Payer: Self-pay

## 2021-11-13 ENCOUNTER — Ambulatory Visit: Payer: 59 | Admitting: Physician Assistant

## 2021-11-13 ENCOUNTER — Encounter: Payer: Self-pay | Admitting: Physician Assistant

## 2021-11-13 DIAGNOSIS — L570 Actinic keratosis: Secondary | ICD-10-CM | POA: Diagnosis not present

## 2021-11-13 MED ORDER — TOLAK 4 % EX CREA
1.0000 | TOPICAL_CREAM | Freq: Every evening | CUTANEOUS | 0 refills | Status: AC
Start: 2021-11-13 — End: 2021-12-13

## 2021-11-13 NOTE — Progress Notes (Signed)
   Follow-Up Visit   Subjective  Alexandra Petty is a 40 y.o. female who presents for the following: Follow-up (Here for 6 month follow up. Here to discuss tolak treatment for actinic damage on her face ).   The following portions of the chart were reviewed this encounter and updated as appropriate:  Tobacco  Allergies  Meds  Problems  Med Hx  Surg Hx  Fam Hx      Objective  Well appearing patient in no apparent distress; mood and affect are within normal limits.  A focused examination was performed including face. Relevant physical exam findings are noted in the Assessment and Plan.  Head - Anterior (Face) Diffuse patches with gritty scale.      Assessment & Plan  AK (actinic keratosis) Head - Anterior (Face)  Fluorouracil (TOLAK) 4 % CREA - Head - Anterior (Face) Apply 1 application topically at bedtime.   Call if problems or questions regarding the treatment. Informed that she can have a brisk reaction with redness, burning, blistering and scab formation.   I, Eliyahu Bille, PA-C, have reviewed all documentation's for this visit.  The documentation on 11/13/21 for the exam, diagnosis, procedures and orders are all accurate and complete.

## 2021-11-29 ENCOUNTER — Other Ambulatory Visit (HOSPITAL_COMMUNITY): Payer: Self-pay

## 2021-11-29 ENCOUNTER — Other Ambulatory Visit: Payer: Self-pay | Admitting: Registered Nurse

## 2021-11-29 DIAGNOSIS — F418 Other specified anxiety disorders: Secondary | ICD-10-CM

## 2021-12-02 NOTE — Telephone Encounter (Signed)
Patient is requesting a refill of the following medications: Requested Prescriptions   Pending Prescriptions Disp Refills   buPROPion ER (WELLBUTRIN SR) 100 MG 12 hr tablet 180 tablet 0    Sig: Take 1 tablet (100 mg total) by mouth 2 (two) times daily.    Date of patient request: 11/29/2021 Last office visit: 01/30/2021 Date of last refill: 07/25/2021 Last refill amount: 180 tablets Follow up time period per chart: n/a

## 2021-12-03 ENCOUNTER — Other Ambulatory Visit (HOSPITAL_COMMUNITY): Payer: Self-pay

## 2021-12-03 MED ORDER — BUPROPION HCL ER (SR) 100 MG PO TB12
100.0000 mg | ORAL_TABLET | Freq: Two times a day (BID) | ORAL | 0 refills | Status: DC
  Filled 2021-12-03: qty 180, 90d supply, fill #0

## 2021-12-18 ENCOUNTER — Encounter: Payer: Self-pay | Admitting: Physician Assistant

## 2022-03-04 ENCOUNTER — Other Ambulatory Visit (HOSPITAL_COMMUNITY): Payer: Self-pay

## 2022-04-03 ENCOUNTER — Other Ambulatory Visit: Payer: Self-pay | Admitting: Registered Nurse

## 2022-04-03 DIAGNOSIS — F418 Other specified anxiety disorders: Secondary | ICD-10-CM

## 2022-04-04 ENCOUNTER — Other Ambulatory Visit (HOSPITAL_COMMUNITY): Payer: Self-pay

## 2022-04-04 MED ORDER — BUPROPION HCL ER (SR) 100 MG PO TB12
100.0000 mg | ORAL_TABLET | Freq: Two times a day (BID) | ORAL | 0 refills | Status: DC
  Filled 2022-04-04: qty 180, 90d supply, fill #0

## 2022-04-23 ENCOUNTER — Ambulatory Visit: Payer: 59 | Admitting: Cardiology

## 2022-05-14 ENCOUNTER — Ambulatory Visit: Payer: 59 | Admitting: Physician Assistant

## 2022-05-14 ENCOUNTER — Encounter: Payer: Self-pay | Admitting: Physician Assistant

## 2022-05-14 DIAGNOSIS — L720 Epidermal cyst: Secondary | ICD-10-CM | POA: Diagnosis not present

## 2022-05-14 DIAGNOSIS — L570 Actinic keratosis: Secondary | ICD-10-CM

## 2022-05-14 MED ORDER — TOLAK 4 % EX CREA: 1.0000 "application " | TOPICAL_CREAM | Freq: Every evening | CUTANEOUS | 0 refills | Status: DC

## 2022-05-14 NOTE — Progress Notes (Signed)
   Follow-Up Visit   Subjective  Alexandra Petty is a 41 y.o. female who presents for the following: Follow-up (Tolak on the face positive reaction ). Her face is much smoother per patient.    The following portions of the chart were reviewed this encounter and updated as appropriate:  Tobacco  Allergies  Meds  Problems  Med Hx  Surg Hx  Fam Hx      Objective  Well appearing patient in no apparent distress; mood and affect are within normal limits.  A focused examination was performed including face and back. Relevant physical exam findings are noted in the Assessment and Plan.  face and chest Diffuse AK treated with tolak positive reaction   Left Upper Eyelid White papule   Assessment & Plan  AK (actinic keratosis) face and chest  Repeat tolak in the winter for the chest  Fluorouracil (TOLAK) 4 % CREA - face and chest Apply 1 application. topically at bedtime.  Milia Left Upper Eyelid  observe    I, Brittani Purdum, PA-C, have reviewed all documentation's for this visit.  The documentation on 05/14/22 for the exam, diagnosis, procedures and orders are all accurate and complete.

## 2022-06-02 ENCOUNTER — Other Ambulatory Visit (HOSPITAL_COMMUNITY): Payer: Self-pay

## 2022-09-05 ENCOUNTER — Other Ambulatory Visit (HOSPITAL_COMMUNITY): Payer: Self-pay

## 2022-09-05 ENCOUNTER — Other Ambulatory Visit: Payer: Self-pay | Admitting: Registered Nurse

## 2022-09-05 DIAGNOSIS — F418 Other specified anxiety disorders: Secondary | ICD-10-CM

## 2022-09-07 ENCOUNTER — Other Ambulatory Visit (HOSPITAL_COMMUNITY): Payer: Self-pay

## 2022-09-08 ENCOUNTER — Other Ambulatory Visit (HOSPITAL_COMMUNITY): Payer: Self-pay

## 2022-12-02 ENCOUNTER — Other Ambulatory Visit: Payer: Self-pay | Admitting: Registered Nurse

## 2022-12-02 ENCOUNTER — Other Ambulatory Visit (HOSPITAL_COMMUNITY): Payer: Self-pay

## 2022-12-02 ENCOUNTER — Other Ambulatory Visit: Payer: Self-pay | Admitting: Cardiology

## 2022-12-02 DIAGNOSIS — F418 Other specified anxiety disorders: Secondary | ICD-10-CM

## 2022-12-02 DIAGNOSIS — I10 Essential (primary) hypertension: Secondary | ICD-10-CM

## 2022-12-02 MED ORDER — BUPROPION HCL ER (SR) 100 MG PO TB12
100.0000 mg | ORAL_TABLET | Freq: Two times a day (BID) | ORAL | 0 refills | Status: DC
  Filled 2022-12-02: qty 180, 90d supply, fill #0

## 2022-12-04 ENCOUNTER — Other Ambulatory Visit (HOSPITAL_COMMUNITY): Payer: Self-pay

## 2022-12-31 ENCOUNTER — Other Ambulatory Visit: Payer: Self-pay | Admitting: Cardiology

## 2022-12-31 DIAGNOSIS — I10 Essential (primary) hypertension: Secondary | ICD-10-CM

## 2023-01-01 ENCOUNTER — Other Ambulatory Visit (HOSPITAL_COMMUNITY): Payer: Self-pay

## 2023-01-01 ENCOUNTER — Other Ambulatory Visit: Payer: Self-pay | Admitting: Cardiology

## 2023-01-01 DIAGNOSIS — I10 Essential (primary) hypertension: Secondary | ICD-10-CM

## 2023-01-01 MED ORDER — VALSARTAN-HYDROCHLOROTHIAZIDE 320-25 MG PO TABS
1.0000 | ORAL_TABLET | Freq: Every day | ORAL | 0 refills | Status: DC
  Filled 2023-01-01: qty 30, 30d supply, fill #0

## 2023-01-14 ENCOUNTER — Ambulatory Visit: Payer: Commercial Managed Care - PPO | Admitting: Nurse Practitioner

## 2023-01-14 ENCOUNTER — Encounter: Payer: Self-pay | Admitting: Nurse Practitioner

## 2023-01-14 ENCOUNTER — Other Ambulatory Visit (HOSPITAL_COMMUNITY): Payer: Self-pay

## 2023-01-14 VITALS — BP 130/88 | HR 70 | Temp 98.6°F | Ht 67.25 in | Wt 253.0 lb

## 2023-01-14 DIAGNOSIS — Z1159 Encounter for screening for other viral diseases: Secondary | ICD-10-CM | POA: Diagnosis not present

## 2023-01-14 DIAGNOSIS — Z9884 Bariatric surgery status: Secondary | ICD-10-CM | POA: Diagnosis not present

## 2023-01-14 DIAGNOSIS — F418 Other specified anxiety disorders: Secondary | ICD-10-CM

## 2023-01-14 DIAGNOSIS — Z7689 Persons encountering health services in other specified circumstances: Secondary | ICD-10-CM

## 2023-01-14 DIAGNOSIS — E559 Vitamin D deficiency, unspecified: Secondary | ICD-10-CM | POA: Diagnosis not present

## 2023-01-14 DIAGNOSIS — E669 Obesity, unspecified: Secondary | ICD-10-CM | POA: Diagnosis not present

## 2023-01-14 DIAGNOSIS — I10 Essential (primary) hypertension: Secondary | ICD-10-CM | POA: Diagnosis not present

## 2023-01-14 DIAGNOSIS — Z1231 Encounter for screening mammogram for malignant neoplasm of breast: Secondary | ICD-10-CM

## 2023-01-14 LAB — COMPREHENSIVE METABOLIC PANEL
ALT: 24 U/L (ref 0–35)
AST: 20 U/L (ref 0–37)
Albumin: 4 g/dL (ref 3.5–5.2)
Alkaline Phosphatase: 82 U/L (ref 39–117)
BUN: 9 mg/dL (ref 6–23)
CO2: 29 mEq/L (ref 19–32)
Calcium: 8.9 mg/dL (ref 8.4–10.5)
Chloride: 102 mEq/L (ref 96–112)
Creatinine, Ser: 0.72 mg/dL (ref 0.40–1.20)
GFR: 102.63 mL/min (ref >60.00)
Glucose, Bld: 92 mg/dL (ref 70–99)
Potassium: 4 mEq/L (ref 3.5–5.1)
Sodium: 139 mEq/L (ref 135–145)
Total Bilirubin: 0.4 mg/dL (ref 0.2–1.2)
Total Protein: 6.8 g/dL (ref 6.0–8.3)

## 2023-01-14 LAB — CBC
HCT: 39.5 % (ref 36.0–46.0)
Hemoglobin: 13.2 g/dL (ref 12.0–15.0)
MCHC: 33.5 g/dL (ref 30.0–36.0)
MCV: 93.2 fl (ref 78.0–100.0)
Platelets: 428 10*3/uL — ABNORMAL HIGH (ref 150.0–400.0)
RBC: 4.23 Mil/uL (ref 3.87–5.11)
RDW: 14.3 % (ref 11.5–15.5)
WBC: 8 10*3/uL (ref 4.0–10.5)

## 2023-01-14 LAB — LIPID PANEL
Cholesterol: 189 mg/dL (ref 0–200)
HDL: 52.5 mg/dL (ref >39.00)
LDL Cholesterol: 113 mg/dL — ABNORMAL HIGH (ref 0–99)
NonHDL: 136.92
Total CHOL/HDL Ratio: 4
Triglycerides: 120 mg/dL (ref 0.0–149.0)
VLDL: 24 mg/dL (ref 0.0–40.0)

## 2023-01-14 LAB — HEMOGLOBIN A1C: Hgb A1c MFr Bld: 5.3 % (ref 4.6–6.5)

## 2023-01-14 LAB — VITAMIN B12: Vitamin B-12: 155 pg/mL — ABNORMAL LOW (ref 211–911)

## 2023-01-14 LAB — TSH: TSH: 2.27 u[IU]/mL (ref 0.35–5.50)

## 2023-01-14 LAB — VITAMIN D 25 HYDROXY (VIT D DEFICIENCY, FRACTURES): VITD: 12.33 ng/mL — ABNORMAL LOW (ref 30.00–100.00)

## 2023-01-14 MED ORDER — VENLAFAXINE HCL 25 MG PO TABS
25.0000 mg | ORAL_TABLET | Freq: Two times a day (BID) | ORAL | 1 refills | Status: DC
  Filled 2023-01-14: qty 60, 30d supply, fill #0
  Filled 2023-02-19: qty 60, 30d supply, fill #1

## 2023-01-14 NOTE — Assessment & Plan Note (Signed)
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  Pending vitamin D

## 2023-01-14 NOTE — Assessment & Plan Note (Signed)
Patient has been on Trintellix in the past and is currently on Wellbutrin.  States it seems she is having more anxiety versus depression symptoms.  We will discontinue Wellbutrin and she will go from taking 1 tablet twice a day to 1 tablet once a day for a week then stop she will then start venlafaxine 25 mg twice daily thereafter follow-up 2 months sooner if needed

## 2023-01-14 NOTE — Progress Notes (Signed)
New Patient Office Visit  Subjective    Patient ID: Rasheema Truluck, female    DOB: 1980/12/26  Age: 42 y.o. MRN: [id number]  CC:  Chief Complaint  Patient presents with   Establish Care    Would like labs     HPI Nedda Gains presents to establish care  HTN: States that she does check her blood pressure at home 3 times a week. States some of the readings 122/83. Dr Virgina Jock. States that she has had a calcium scoring and it was zero   Anxiety/MDD: states that sometimes she feels worse on the medication. States that she feels fine sometimes. States that she use to be on trintellix in the past. With trintellix she would have increased mood with her cycle. 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 She does a tincture of D and 12 that is daily   Tdap: states that it was 2019 Flu: 09/2022 Covid: pfizer x2 and booster Shingles: too young  Pap: 2015. States that she had an abnormal and colp. AMb refer gyn [place] Mammo:2020 solias in [place] Colon cancer: too young, currently average risk   : Outpatient Encounter Medications as of 01/14/2023  Medication Sig   buPROPion ER (WELLBUTRIN SR) 100 MG 12 hr tablet Take 1 tablet (100 mg total) by mouth 2 (two) times daily.   valsartan-hydrochlorothiazide (DIOVAN-HCT) 320-25 MG tablet Take 1 tablet by mouth daily.   venlafaxine (EFFEXOR) 25 MG tablet Take 1 tablet (25 mg total) by mouth 2 (two) times daily.   Fluorouracil (TOLAK) 4 % CREA Apply 1 application. topically at bedtime. (Patient not taking: Reported on 01/14/2023)   No facility-administered encounter medications on file as of 01/14/2023.    Past Medical History:  Diagnosis Date   Anxiety    Atypical mole 2022   moderate left arm marg free   Depression    Phreesia 05/01/2020   GERD (gastroesophageal reflux disease)    Hypertension     Past Surgical History:  Procedure Laterality Date   APPENDECTOMY      GASTRIC BYPASS     GASTRIC ROUX-EN-Y N/A 01/01/2015   Procedure: LAPAROSCOPIC ROUX-EN-Y GASTRIC BYPASS WITH UPPER ENDOSCOPY;  Surgeon: Pedro Earls, MD;  Location: [facility];  Service: General;  Laterality: N/A;   HIATAL HERNIA REPAIR N/A 01/01/2015   Procedure: LAPAROSCOPIC REPAIR OF HIATAL HERNIA;  Surgeon: Pedro Earls, MD;  Location: [facility];  Service: General;  Laterality: N/A;    Family History  Problem Relation Age of Onset   Arthritis Mother    Alcohol abuse Father    Diabetes Father    Heart disease Father    Hyperlipidemia Father    Hypertension Father    Cancer Paternal Aunt        stomach    Social History   Socioeconomic History   Marital status: Married    Spouse name: Marylyn Ishihara   Number of children: 0   Years of education: 12+   Highest education level: Bachelor's degree (e.g., BA, AB, BS)  Occupational History   Occupation: Aeronautical engineer: [employer]  Tobacco Use   Smoking status: Never   Smokeless tobacco: Never  Vaping Use   Vaping Use: Never used  Substance and Sexual Activity   Alcohol use: Yes    Comment: occ   Drug use: No   Sexual activity: Not on file  Other  Topics Concern   Not on file  Social History Narrative   Fulltime: [employer] for [employer]      Hobbies: kayaking, tennis, exercise    Social Determinants of Health   Financial Resource Strain: Not on file  Food Insecurity: Not on file  Transportation Needs: Not on file  Physical Activity: Not on file  Stress: Not on file  Social Connections: Not on file  Intimate Partner Violence: Not on file    Review of Systems  Constitutional:  Negative for chills and fever.  Respiratory:  Negative for shortness of breath.   Cardiovascular:  Negative for chest pain and leg swelling (with dependency).  Gastrointestinal:  Negative for abdominal pain, blood in stool, constipation, diarrhea, nausea and vomiting.       BM daily   Genitourinary:  Negative for  dysuria and hematuria.  Neurological:  Negative for tingling and headaches.  Psychiatric/Behavioral:  Negative for hallucinations and suicidal ideas.         Objective    BP 130/88   Pulse 70   Temp 98.6 F (37 C) (Oral)   Ht 5' 7.25" (1.708 m)   Wt 253 lb (114.8 kg)   LMP 12/31/2022   SpO2 97%   BMI 39.33 kg/m   Physical Exam Vitals and nursing note reviewed.  Constitutional:      Appearance: Normal appearance.  HENT:     Right Ear: Tympanic membrane, ear canal and external ear normal.     Left Ear: Tympanic membrane, ear canal and external ear normal.     Mouth/Throat:     Mouth: Mucous membranes are moist.     Pharynx: Oropharynx is clear.  Eyes:     Extraocular Movements: Extraocular movements intact.     Pupils: Pupils are equal, round, and reactive to light.  Cardiovascular:     Rate and Rhythm: Normal rate and regular rhythm.     Pulses: Normal pulses.     Heart sounds: Normal heart sounds.  Pulmonary:     Effort: Pulmonary effort is normal.     Breath sounds: Normal breath sounds.  Musculoskeletal:     Right lower leg: No edema.     Left lower leg: No edema.  Lymphadenopathy:     Cervical: No cervical adenopathy.  Skin:    General: Skin is warm.  Neurological:     General: No focal deficit present.     Mental Status: She is alert.     Deep Tendon Reflexes:     Reflex Scores:      Bicep reflexes are 2+ on the right side and 2+ on the left side.      Patellar reflexes are 2+ on the right side and 2+ on the left side.    Comments: Bilateral upper and lower extremity strength 5/5  Psychiatric:        Mood and Affect: Mood normal.        Behavior: Behavior normal.        Thought Content: Thought content normal.        Judgment: Judgment normal.         Assessment & Plan:   Problem List Items Addressed This Visit       Cardiovascular and Mediastinum   Essential hypertension    Patient currently maintained on valsartan-hydrochlorothiazide.   She is followed by cardiology.  Continue taking medication as prescribed follow-up with cardiology as recommended.  Blood pressure within normal limits today  Relevant Orders   CBC   Lipid panel   Comprehensive metabolic panel   Hemoglobin A1c     Other   Vitamin D deficiency    She does use a Juengel tincture of vitamin D and B12 in setting of Roux-en-Y.  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  States it seems she is having more anxiety versus depression symptoms.  We will discontinue Wellbutrin and she will go from taking 1 tablet twice a day to 1 tablet once a day for a week then stop she will then start venlafaxine 25 mg twice daily thereafter follow-up 2 months sooner if needed      Relevant Medications   venlafaxine (EFFEXOR) 25 MG tablet   Other Visit Diagnoses     Encounter for screening mammogram for malignant neoplasm of breast    -  Primary   Relevant Orders   MM 3D SCREEN BREAST BILATERAL   Encounter for hepatitis C screening test for low risk patient       Relevant Orders   Hepatitis C Antibody   Obesity (BMI 30-39.9)       Relevant Orders   Hemoglobin A1c   TSH   Encounter to establish care       Relevant Orders   Ambulatory referral to Gynecology       Return in about 2 months (around 03/15/2023) for MDD/GAD recheck .   Romilda Garret, NP

## 2023-01-14 NOTE — Assessment & Plan Note (Signed)
Patient currently maintained on valsartan-hydrochlorothiazide.  She is followed by cardiology.  Continue taking medication as prescribed follow-up with cardiology as recommended.  Blood pressure within normal limits today

## 2023-01-14 NOTE — Assessment & Plan Note (Signed)
History of Roux-en-Y with hiatal hernia repair pending labs inclusive of B12 and vitamin D

## 2023-01-14 NOTE — Patient Instructions (Signed)
Start taking 1 tablet of the Wellbutrin for a week then stop. Then start the Effexor (venlafaxine) Follow up with me in 2 months for a recheck We can do your physical in 6-8 months.

## 2023-01-15 ENCOUNTER — Other Ambulatory Visit: Payer: Self-pay | Admitting: Nurse Practitioner

## 2023-01-15 ENCOUNTER — Other Ambulatory Visit (HOSPITAL_COMMUNITY): Payer: Self-pay

## 2023-01-15 DIAGNOSIS — E538 Deficiency of other specified B group vitamins: Secondary | ICD-10-CM

## 2023-01-15 DIAGNOSIS — E559 Vitamin D deficiency, unspecified: Secondary | ICD-10-CM

## 2023-01-15 LAB — HEPATITIS C ANTIBODY: Hepatitis C Ab: NONREACTIVE

## 2023-01-15 MED ORDER — VITAMIN D (ERGOCALCIFEROL) 1.25 MG (50000 UNIT) PO CAPS
50000.0000 [IU] | ORAL_CAPSULE | ORAL | 0 refills | Status: DC
  Filled 2023-01-15: qty 12, 84d supply, fill #0

## 2023-01-21 ENCOUNTER — Encounter: Payer: Self-pay | Admitting: Cardiology

## 2023-01-21 ENCOUNTER — Ambulatory Visit: Payer: Commercial Managed Care - PPO | Admitting: Cardiology

## 2023-01-21 ENCOUNTER — Other Ambulatory Visit (HOSPITAL_COMMUNITY): Payer: Self-pay

## 2023-01-21 VITALS — BP 153/84 | HR 64 | Resp 16 | Ht 67.0 in | Wt 257.0 lb

## 2023-01-21 DIAGNOSIS — I10 Essential (primary) hypertension: Secondary | ICD-10-CM | POA: Diagnosis not present

## 2023-01-21 DIAGNOSIS — H43813 Vitreous degeneration, bilateral: Secondary | ICD-10-CM | POA: Diagnosis not present

## 2023-01-21 MED ORDER — VALSARTAN-HYDROCHLOROTHIAZIDE 320-25 MG PO TABS
1.0000 | ORAL_TABLET | Freq: Every day | ORAL | 3 refills | Status: DC
  Filled 2023-01-21 – 2023-02-02 (×2): qty 90, 90d supply, fill #0
  Filled 2023-04-28: qty 90, 90d supply, fill #1
  Filled 2023-07-29: qty 90, 90d supply, fill #2
  Filled 2023-11-13: qty 90, 90d supply, fill #3

## 2023-01-21 NOTE — Progress Notes (Signed)
Patient referred by Maximiano Coss, NP for hypertension, family history of early CAD  Subjective:   Alexandra Petty, female    DOB: [birth year], 42 y.o.   MRN: [id number]   Chief Complaint  Patient presents with   Hypertension   Medication Management   Follow-up    1 year     HPI  42 y.o. Caucasian female with hypertension, family h/o early CAD  Patient is doing well, denies chest pain, shortness of breath, palpitations, leg edema, orthopnea, PND, TIA/syncope. Blood pressure elevated today, but generally well controlled at home.   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  Work is busy with long hours and stress.  Gained about 60 pounds at the beginning of the pandemic.  This also correlated with increasing her resting blood pressure.  Over the last few months, she is made significant changes to her lifestyle.  She tries to walk 10,000 steps every day.  She eats primarily fish-based diet, does not any excess salt and avoids other salt heavy food.  She does drink 4 to 5 cups of coffee every day, along with an occasional cup of black tea every now and then.  She does not drink alcohol.  Blood pressure is elevated today.  She is currently on hydrochlorothiazide 25 mg daily.  She denies any chest pain or shortness of breath symptoms.  Her biological father had a fatal MI at age 80.  Her biological paternal uncle also had early coronary artery disease.    Current Outpatient Medications:    Fluorouracil (TOLAK) 4 % CREA, Apply 1 application. topically at bedtime., Disp: 40 g, Rfl: 0   valsartan-hydrochlorothiazide (DIOVAN-HCT) 320-25 MG tablet, Take 1 tablet by mouth daily., Disp: 30 tablet, Rfl: 0   venlafaxine (EFFEXOR) 25 MG tablet, Take 1 tablet (25 mg total) by mouth 2 (two) times daily., Disp: 60 tablet, Rfl: 1   Vitamin D, Ergocalciferol, (DRISDOL) 1.25 MG (50000 UNIT) CAPS capsule, Take 1  capsule (50,000 Units total) by mouth every 7 (seven) days., Disp: 12 capsule, Rfl: 0  Cardiovascular and other pertinent studies:  EKG 01/21/2023: Sinus rhythm 69 bpm  Low voltage in precordial leads  Echocardiogram 03/25/2021:  Left ventricle cavity is normal in size and wall thickness. Normal global  wall motion. Normal LV systolic function with EF 71%. Normal diastolic  filling pattern.  No signifiant valvular abnormality.  Normal right atrial pressure.   CT Cardiac scoring 03/20/2021: Calcium score: 0 No abnormaliies  Recent labs: 01/14/2023: Glucose 92, BUN/Cr 9/0.72. EGFR 102. Na/K 139/4.0. Rest of the CMP normal H/H 13/39. MCV 93. Platelets 428 HbA1C 5.3% Chol 189, TG 120, HDL 52, LDL 113 TSH 2.2 normal  05/08/2021: Glucose 84, BUN/Cr 8/0.74. EGFR 104. Na/K 138/4.6.   04/20/2020: Glucose 76, BUN/Cr 9/0.75. EGFR 100. Na/K 138/4.8. Rest of the CMP normal H/H 12.1/38.6. MCV 86. Platelets 459 HbA1C 5.2% Chol 195, TG 218, HDL 61, LDL 97 TSH 2.0 normal    Review of Systems  Cardiovascular:  Negative for chest pain, dyspnea on exertion, leg swelling, palpitations and syncope.        Vitals:   01/21/23 1017  BP: (!) 153/84  Pulse: 64  Resp: 16  SpO2: 96%     Body mass index is 40.25 kg/m. Filed Weights   01/21/23 1017  Weight: 257 lb (116.6 kg)     Objective:   Physical Exam Vitals and nursing note reviewed.  Constitutional:  General: She is not in acute distress.    Appearance: She is obese.  Neck:     Vascular: No JVD.  Cardiovascular:     Rate and Rhythm: Normal rate and regular rhythm.     Heart sounds: Normal heart sounds. No murmur heard. Pulmonary:     Effort: Pulmonary effort is normal.     Breath sounds: Normal breath sounds. No wheezing or rales.         Assessment & Recommendations:   42 y.o. Caucasian female with hypertension, family h/o early CAD  Hypertension: Probably primary.  Normal TSH.  Obesity likely an underlying  factor.  Given her irregular menses, I encouraged her to discuss with her OB/GYN regarding possibility of PCOS.  Continue low-salt diet and regular aerobic activity. Currently on valsartan-hydrochlorothiazide 320-25 mg daily.   Blood pressure very well-controlled at home, also noted to be fairly well-controlled on recent PCP visit.  No change made today.  Refill valsartan-HCTZ 320-25 mg daily.    Screening for CAD: Strong family history of early coronary artery disease.   Fortunately, calcium score 0.  Lipids are reasonable.  No clear indication for statin at this time.    F/u in 1 year     Nigel Mormon, MD Pager: [phone] Office: [phone]

## 2023-02-02 ENCOUNTER — Other Ambulatory Visit: Payer: Self-pay

## 2023-02-02 ENCOUNTER — Other Ambulatory Visit (HOSPITAL_COMMUNITY): Payer: Self-pay

## 2023-02-20 ENCOUNTER — Other Ambulatory Visit (HOSPITAL_COMMUNITY): Payer: Self-pay

## 2023-04-08 ENCOUNTER — Other Ambulatory Visit: Payer: Self-pay | Admitting: Nurse Practitioner

## 2023-04-08 DIAGNOSIS — E559 Vitamin D deficiency, unspecified: Secondary | ICD-10-CM

## 2023-04-08 DIAGNOSIS — F418 Other specified anxiety disorders: Secondary | ICD-10-CM

## 2023-04-09 NOTE — Telephone Encounter (Signed)
Patient never followed up to recheck medications. Please call and schedule

## 2023-04-10 NOTE — Telephone Encounter (Signed)
LVM for patient to call back and schedule

## 2023-04-28 ENCOUNTER — Other Ambulatory Visit (HOSPITAL_COMMUNITY): Payer: Self-pay

## 2023-05-08 NOTE — Telephone Encounter (Signed)
[place]. Have scheduled for follow up on 6/17.

## 2023-05-12 ENCOUNTER — Other Ambulatory Visit (HOSPITAL_COMMUNITY): Payer: Self-pay

## 2023-05-12 MED ORDER — VENLAFAXINE HCL 25 MG PO TABS
25.0000 mg | ORAL_TABLET | Freq: Two times a day (BID) | ORAL | 0 refills | Status: DC
  Filled 2023-05-12: qty 60, 30d supply, fill #0

## 2023-05-13 ENCOUNTER — Other Ambulatory Visit (HOSPITAL_COMMUNITY): Payer: Self-pay

## 2023-05-21 ENCOUNTER — Encounter: Payer: Commercial Managed Care - PPO | Admitting: Advanced Practice Midwife

## 2023-05-25 ENCOUNTER — Encounter: Payer: Self-pay | Admitting: Nurse Practitioner

## 2023-05-25 ENCOUNTER — Ambulatory Visit: Payer: Commercial Managed Care - PPO | Admitting: Nurse Practitioner

## 2023-05-25 ENCOUNTER — Other Ambulatory Visit (HOSPITAL_COMMUNITY): Payer: Self-pay

## 2023-05-25 VITALS — BP 134/88 | HR 80 | Temp 97.6°F | Resp 16 | Ht 67.0 in | Wt 257.1 lb

## 2023-05-25 DIAGNOSIS — Z9884 Bariatric surgery status: Secondary | ICD-10-CM

## 2023-05-25 DIAGNOSIS — E538 Deficiency of other specified B group vitamins: Secondary | ICD-10-CM

## 2023-05-25 DIAGNOSIS — F419 Anxiety disorder, unspecified: Secondary | ICD-10-CM

## 2023-05-25 MED ORDER — CYANOCOBALAMIN 1000 MCG/ML IJ SOLN
1000.0000 ug | Freq: Once | INTRAMUSCULAR | Status: AC
  Administered 2023-05-25: 1000 ug via INTRAMUSCULAR

## 2023-05-25 MED ORDER — CYANOCOBALAMIN 1000 MCG/ML IJ SOLN
INTRAMUSCULAR | 1 refills | Status: AC
  Filled 2023-05-25: qty 3, 21d supply, fill #0
  Filled 2023-06-29: qty 3, 21d supply, fill #1

## 2023-05-25 NOTE — Assessment & Plan Note (Signed)
PHQ-9 and GAD-7 administered in office.  Patient is tolerating venlafaxine 25 mg twice daily well she will reach out to me prior to next refill to see if she feels okay and would like to go up on the medication.  Patient denies HI/SI/AVH.

## 2023-05-25 NOTE — Patient Instructions (Signed)
Nice to see you today I will await to hear from you about if you want to go up on the effexor or stay the same. Message me via mychart or call the office  You can schedule the physical in the next 3 months.   With B12 we gave you the first injection today. I want you to do once a week for the next 3 weeks, THEN once every other week for 2 doses. Then once a month thereafter

## 2023-05-25 NOTE — Assessment & Plan Note (Signed)
Likely secondary to Roux-en-Y procedure.  Patient never started her B12 injections will give the first 1 today.  Patient is interested in doing the rest of the injections at home dosing schedule written down for patient.

## 2023-05-25 NOTE — Progress Notes (Signed)
Established Patient Office Visit  Subjective   Patient ID: Alexandra Petty, female    DOB: [birth year]  Age: 42 y.o. MRN: [id number]  Chief Complaint  Patient presents with   Medication Refill       MDD/GAD: patient was on trintellex in the past and Wellbutrin. She was having more anxiety vs depression symptoms. Patient was tappered off the Wellbutrin and started on Effexor 25mg  BID and is here for a recheck.   States she was doing well on medication.  States she did have a period of time where she ran out of medication has been back on it a little over a week feels like she is doing okay denies any adverse drug events.  Will continue medication as is for now  Vitamin B-12 deficiency: Patient has a vitamin deficiency secondary from Roux-en-Y procedure.  B12: Normal limits patient was supposed to get B12 injections about every day administer first B12 injection in office today.  Patient elected to rest injections at home wrote down dosing schedule.     05/25/2023    2:27 PM 01/14/2023    8:25 AM 01/30/2021    8:43 AM  PHQ9 SCORE ONLY  PHQ-9 Total Score 4 3 0       05/25/2023    2:28 PM 01/14/2023    8:26 AM 05/01/2020    9:20 AM  GAD 7 : Generalized Anxiety Score  Nervous, Anxious, on Edge 2 2 0  Control/stop worrying 2 1 0  Worry too much - different things 1 1 0  Trouble relaxing 0 2 0  Restless 0 0 0  Easily annoyed or irritable 1 1 0  Afraid - awful might happen 0 0 0  Total GAD 7 Score 6 7 0  Anxiety Difficulty Not difficult at all Not difficult at all Not difficult at all       Review of Systems  Constitutional:  Negative for chills and fever.  Respiratory:  Negative for shortness of breath.   Cardiovascular:  Negative for chest pain.  Neurological:  Negative for headaches.  Psychiatric/Behavioral:  Negative for hallucinations and suicidal ideas.       Objective:     BP 134/88   Pulse 80   Temp 97.6 F (36.4 C)   Resp 16   Ht 5\' 7"  (1.702 m)   Wt 257 lb 2  oz (116.6 kg)   LMP 05/18/2023 (Approximate)   SpO2 98%   BMI 40.27 kg/m  BP Readings from Last 3 Encounters:  05/25/23 134/88  01/21/23 (!) 153/84  01/14/23 130/88   Wt Readings from Last 3 Encounters:  05/25/23 257 lb 2 oz (116.6 kg)  01/21/23 257 lb (116.6 kg)  01/14/23 253 lb (114.8 kg)      Physical Exam Vitals and nursing note reviewed.  Constitutional:      Appearance: Normal appearance.  Cardiovascular:     Rate and Rhythm: Normal rate and regular rhythm.     Heart sounds: Normal heart sounds.  Pulmonary:     Effort: Pulmonary effort is normal.     Breath sounds: Normal breath sounds.  Neurological:     Mental Status: She is alert.      No results found for any visits on 05/25/23.    The 10-year ASCVD risk score (Arnett DK, et al., 2019) is: 1%    Assessment & Plan:   Problem List Items Addressed This Visit       Other   Lap [name] gastric  bypass with hiatus hernia repair Jan 2016   Anxiety    PHQ-9 and GAD-7 administered in office.  Patient is tolerating venlafaxine 25 mg twice daily well she will reach out to me prior to next refill to see if she feels okay and would like to go up on the medication.  Patient denies HI/SI/AVH.      Vitamin B12 deficiency - Primary    Likely secondary to Roux-en-Y procedure.  Patient never started her B12 injections will give the first 1 today.  Patient is interested in doing the rest of the injections at home dosing schedule written down for patient.      Relevant Medications   cyanocobalamin (VITAMIN B12) 1000 MCG/ML injection    Return in about 3 months (around 08/25/2023) for CPE and Labs.    [name], NP

## 2023-05-28 ENCOUNTER — Other Ambulatory Visit (HOSPITAL_COMMUNITY): Payer: Self-pay

## 2023-06-12 ENCOUNTER — Encounter: Payer: Commercial Managed Care - PPO | Admitting: Obstetrics & Gynecology

## 2023-06-29 ENCOUNTER — Other Ambulatory Visit (HOSPITAL_COMMUNITY): Payer: Self-pay

## 2023-06-29 ENCOUNTER — Other Ambulatory Visit: Payer: Self-pay

## 2023-06-29 ENCOUNTER — Other Ambulatory Visit: Payer: Self-pay | Admitting: Nurse Practitioner

## 2023-06-29 DIAGNOSIS — F418 Other specified anxiety disorders: Secondary | ICD-10-CM

## 2023-06-29 NOTE — Telephone Encounter (Signed)
Can we see if the patient is good at the current does or if she would like to go up

## 2023-07-01 NOTE — Telephone Encounter (Signed)
Spoke to pt. She said the current dose is working well for her.

## 2023-07-02 ENCOUNTER — Other Ambulatory Visit (HOSPITAL_COMMUNITY): Payer: Self-pay

## 2023-07-02 ENCOUNTER — Encounter (HOSPITAL_COMMUNITY): Payer: Self-pay

## 2023-07-02 MED ORDER — VENLAFAXINE HCL 25 MG PO TABS
25.0000 mg | ORAL_TABLET | Freq: Two times a day (BID) | ORAL | 1 refills | Status: DC
  Filled 2023-07-02: qty 180, 90d supply, fill #0

## 2023-07-03 ENCOUNTER — Other Ambulatory Visit (HOSPITAL_COMMUNITY): Payer: Self-pay

## 2023-07-29 ENCOUNTER — Other Ambulatory Visit (HOSPITAL_COMMUNITY): Payer: Self-pay

## 2023-07-29 ENCOUNTER — Other Ambulatory Visit: Payer: Self-pay | Admitting: Nurse Practitioner

## 2023-07-29 ENCOUNTER — Other Ambulatory Visit: Payer: Self-pay

## 2023-07-29 DIAGNOSIS — E538 Deficiency of other specified B group vitamins: Secondary | ICD-10-CM

## 2023-07-29 NOTE — Telephone Encounter (Signed)
Can we get her in for a vitamin B12 lab prior to deciding how to dose her moving forward

## 2023-07-30 NOTE — Telephone Encounter (Signed)
Left voicemail for patient to call the office back. Pt just needs lab visit scheduled for vitamin b-12 level recheck.

## 2023-07-31 NOTE — Telephone Encounter (Signed)
LVM to call office and schedule

## 2023-08-04 NOTE — Telephone Encounter (Signed)
LVM again, sent mychart message

## 2023-08-05 ENCOUNTER — Other Ambulatory Visit: Payer: Self-pay | Admitting: Nurse Practitioner

## 2023-08-05 DIAGNOSIS — Z9884 Bariatric surgery status: Secondary | ICD-10-CM

## 2023-08-05 DIAGNOSIS — E559 Vitamin D deficiency, unspecified: Secondary | ICD-10-CM

## 2023-08-05 DIAGNOSIS — E538 Deficiency of other specified B group vitamins: Secondary | ICD-10-CM

## 2023-08-07 ENCOUNTER — Other Ambulatory Visit: Payer: Commercial Managed Care - PPO

## 2023-08-07 DIAGNOSIS — E559 Vitamin D deficiency, unspecified: Secondary | ICD-10-CM | POA: Diagnosis not present

## 2023-08-07 DIAGNOSIS — E538 Deficiency of other specified B group vitamins: Secondary | ICD-10-CM | POA: Diagnosis not present

## 2023-08-07 DIAGNOSIS — Z9884 Bariatric surgery status: Secondary | ICD-10-CM

## 2023-08-07 LAB — VITAMIN D 25 HYDROXY (VIT D DEFICIENCY, FRACTURES): VITD: 19.61 ng/mL — ABNORMAL LOW (ref 30.00–100.00)

## 2023-08-07 LAB — VITAMIN B12: Vitamin B-12: 472 pg/mL (ref 211–911)

## 2023-08-10 ENCOUNTER — Other Ambulatory Visit: Payer: Self-pay | Admitting: Nurse Practitioner

## 2023-08-10 DIAGNOSIS — E559 Vitamin D deficiency, unspecified: Secondary | ICD-10-CM

## 2023-08-10 MED ORDER — VITAMIN D (ERGOCALCIFEROL) 1.25 MG (50000 UNIT) PO CAPS
50000.0000 [IU] | ORAL_CAPSULE | ORAL | 0 refills | Status: DC
Start: 2023-08-10 — End: 2023-11-13
  Filled 2023-08-10: qty 12, 84d supply, fill #0

## 2023-08-11 ENCOUNTER — Other Ambulatory Visit (HOSPITAL_COMMUNITY): Payer: Self-pay

## 2023-08-18 ENCOUNTER — Other Ambulatory Visit: Payer: Self-pay | Admitting: Nurse Practitioner

## 2023-08-18 ENCOUNTER — Other Ambulatory Visit (HOSPITAL_COMMUNITY): Payer: Self-pay

## 2023-08-18 MED ORDER — CYANOCOBALAMIN 1000 MCG/ML IJ SOLN
1000.0000 ug | INTRAMUSCULAR | 0 refills | Status: AC
Start: 2023-08-18 — End: ?
  Filled 2023-08-18 – 2024-03-08 (×2): qty 3, 90d supply, fill #0

## 2023-08-18 NOTE — Telephone Encounter (Signed)
  Alexandra Petty   I have reviewed your labs   Vitamin D is still low and improved some. I am going to renew the prescription vitamin D pill. Your Vitamin B12 level has increased and is normal. I think we soul continue the B12 injections at once a month. Let me know if you have any questions or concerns     [name], DNP, AGNP-C

## 2023-08-18 NOTE — Telephone Encounter (Signed)
Patient called pack to office. She would like to do at home. She requested we send script for syringe and needles as well as b-12. Order pended as requested. Pended for 3 month with no refill. Not sure how long you wanted her to do.

## 2023-08-28 ENCOUNTER — Other Ambulatory Visit (HOSPITAL_COMMUNITY): Payer: Self-pay

## 2023-10-24 ENCOUNTER — Ambulatory Visit
Admission: EM | Admit: 2023-10-24 | Discharge: 2023-10-24 | Disposition: A | Discharge status: Home / Self Care | Payer: Commercial Managed Care - PPO | Attending: Internal Medicine | Admitting: Internal Medicine

## 2023-10-24 ENCOUNTER — Ambulatory Visit: Payer: Commercial Managed Care - PPO

## 2023-10-24 DIAGNOSIS — H1032 Unspecified acute conjunctivitis, left eye: Secondary | ICD-10-CM

## 2023-10-24 MED ORDER — POLYMYXIN B-TRIMETHOPRIM 10000-0.1 UNIT/ML-% OP SOLN
1.0000 [drp] | OPHTHALMIC | 0 refills | Status: AC
Start: 2023-10-24 — End: 2023-10-31

## 2023-10-24 NOTE — Discharge Instructions (Addendum)
Start Polytrim antibiotic eyedrops to the eye as prescribed.  Avoid contacts until your symptoms resolve.  If you do not have any improvement after 48 hours you may stop this and use over-the-counter antihistamine eyedrops such as Clear Eyes.  Continue warm compresses as needed.  Please follow-up with your PCP or ophthalmologist if symptoms do not improve.  Please go to the ER for any worsening symptoms.  I hope you feel better soon!

## 2023-10-24 NOTE — ED Triage Notes (Signed)
Pt presents to UC w/ c/o left eye redness, pain since yesterday. Pt reports she was taking her contacts out and she thinks she may have scratched her left eye.  Pt reports light sensitivity and eye watering of the left eye. She has flushed her left eye with contact lens solution. Pt also reports 3 days ago she had her make up done for an event and is worried about pink eye. Denies blurred vision.

## 2023-10-24 NOTE — ED Provider Notes (Signed)
[facility]    [name]: [id number] Arrival date & time: 10/24/23  1410      History   Chief Complaint Chief Complaint  Patient presents with   Appointment   Eye Problem    HPI Alexandra Petty is a 42 y.o. female presents for eye redness.  Patient reports 1 day of left eye redness that began after she took out her contacts.  She is not sure if she scratched her eye while doing this.  Endorses some photosensitivity, eye redness, watery drainage, and irritation.  Denies any visual changes, foreign body sensation, eye pain.  Has been wearing glasses since symptoms began.  No URI or allergy symptoms.  Did states she had her make-up done and is concerned it may have been contaminated.  She has been using saline to rinse her eye out and doing warm compresses.  No other concerns at this time.   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   Pre-menstrual mood disorder 07/05/2014   Vitamin D deficiency 03/21/2014   Other malaise and fatigue 03/02/2014   Morbid obesity (HCC) 03/02/2014   Elevated blood pressure reading without diagnosis of hypertension 03/02/2014    Past Surgical History:  Procedure Laterality Date   APPENDECTOMY     GASTRIC BYPASS     GASTRIC ROUX-EN-Y N/A 01/01/2015   Procedure: LAPAROSCOPIC ROUX-EN-Y GASTRIC BYPASS WITH UPPER ENDOSCOPY;  Surgeon: Valarie Merino, MD;  Location: [facility];  Service: General;  Laterality: N/A;   HIATAL HERNIA REPAIR  N/A 01/01/2015   Procedure: LAPAROSCOPIC REPAIR OF HIATAL HERNIA;  Surgeon: Valarie Merino, MD;  Location: [facility];  Service: General;  Laterality: N/A;    OB History   No obstetric history on file.      Home Medications    Prior to Admission medications   Medication Sig Start Date End Date Taking? Authorizing Provider  trimethoprim-polymyxin b (POLYTRIM) ophthalmic solution Place 1 drop into the left eye every 4 (four) hours while awake for 7 days. 10/24/23 10/31/23 Yes Radford Pax, NP  valsartan-hydrochlorothiazide (DIOVAN-HCT) 320-25 MG tablet Take 1 tablet by mouth daily. 01/21/23  Yes Patwardhan, Manish J, MD  Vitamin D, Ergocalciferol, (DRISDOL) 1.25 MG (50000 UNIT) CAPS capsule Take 1 capsule (50,000 Units total) by mouth every 7 (seven) days. 08/10/23  Yes Eden Emms, NP  cyanocobalamin (VITAMIN B12) 1000 MCG/ML injection Inject 1 mL (1,000 mcg total) into the muscle every 30 (thirty) days. 08/18/23   Eden Emms, NP  Fluorouracil (TOLAK) 4 % CREA Apply 1 application. topically at bedtime. Patient not taking: Reported on 05/25/2023 05/14/22   Glyn Ade, PA-C  venlafaxine (EFFEXOR) 25 MG tablet Take 1 tablet (25 mg total) by mouth 2 (two) times daily. 07/02/23   Eden Emms, NP    Family History Family History  Problem Relation Age of Onset   Arthritis Mother    Alcohol abuse  Father    Diabetes Father    Heart disease Father    Hyperlipidemia Father    Hypertension Father    Cancer Paternal Aunt        stomach    Social History Social History   Tobacco Use   Smoking status: Never   Smokeless tobacco: Never  Vaping Use   Vaping status: Never Used  Substance Use Topics   Alcohol use: Yes    Comment: occ   Drug use: No     Allergies   Patient has no known allergies.   Review of Systems Review of Systems  Eyes:  Positive for discharge and redness.     Physical Exam Triage Vital Signs ED Triage Vitals  Encounter Vitals Group     BP  10/24/23 1423 124/84     Systolic BP Percentile --      Diastolic BP Percentile --      Pulse Rate 10/24/23 1423 75     Resp 10/24/23 1423 16     Temp 10/24/23 1423 99.1 F (37.3 C)     Temp Source 10/24/23 1423 Oral     SpO2 10/24/23 1423 97 %     Weight --      Height --      Head Circumference --      Peak Flow --      Pain Score 10/24/23 1421 0     Pain Loc --      Pain Education --      Exclude from Growth Chart --    No data found.  Updated Vital Signs BP 124/84 (BP Location: Right Arm)   Pulse 75   Temp 99.1 F (37.3 C) (Oral)   Resp 16   SpO2 97%   Visual Acuity Right Eye Distance: 20/25 (wearing glasses) Left Eye Distance: 20/30 (wearing glasses) Bilateral Distance: 20/25 (wearing glasses)  Right Eye Near:   Left Eye Near:    Bilateral Near:     Physical Exam Vitals and nursing note reviewed.  Constitutional:      General: She is not in acute distress.    Appearance: Normal appearance. She is not ill-appearing.  HENT:     Head: Normocephalic and atraumatic.  Eyes:     General: Lids are normal.        Left eye: No foreign body or hordeolum.     Conjunctiva/sclera:     Left eye: Left conjunctiva is injected. No chemosis, exudate or hemorrhage.    Pupils: Pupils are equal, round, and reactive to light.     Left eye: No corneal abrasion or fluorescein uptake.     Comments: Mild watery discharge with mild edema conjunctiva  Cardiovascular:     Rate and Rhythm: Normal rate.  Pulmonary:     Effort: Pulmonary effort is normal.  Skin:    General: Skin is warm and dry.  Neurological:     General: No focal deficit present.     Mental Status: She is alert and oriented to person, place, and time.  Psychiatric:        Mood and Affect: Mood normal.        Behavior: Behavior normal.      UC Treatments / Results  Labs (all labs ordered are listed, but only abnormal results are displayed) Labs Reviewed - No data to display  EKG   Radiology No  results found.  Procedures Procedures (including critical care time)  Medications Ordered in UC Medications - No data  to display  Initial Impression / Assessment and Plan / UC Course  I have reviewed the triage vital signs and the nursing notes.  Pertinent labs & imaging results that were available during my care of the patient were reviewed by me and considered in my medical decision making (see chart for details).      Reviewed exam and symptoms with patient.  No red flags.  Negative fluorescein uptake.  Discussed different types of conjunctivitis.  As patient is concern for bacterial process given recent make-up usage we will do Polytrim eyedrops.  Advise after 40 hours if no improvement may stop this and do antihistamine eyedrops such as Clear Eyes.  Avoid contacts until symptoms improve.  Continue warm compresses as needed.  PCP or ophthalmology follow-up if symptoms do not improve.  ER precautions reviewed. Final Clinical Impressions(s) / UC Diagnoses   Final diagnoses:  Acute conjunctivitis of left eye, unspecified acute conjunctivitis type     Discharge Instructions      Start Polytrim antibiotic eyedrops to the eye as prescribed.  Avoid contacts until your symptoms resolve.  If you do not have any improvement after 48 hours you may stop this and use over-the-counter antihistamine eyedrops such as Clear Eyes.  Continue warm compresses as needed.  Please follow-up with your PCP or ophthalmologist if symptoms do not improve.  Please go to the ER for any worsening symptoms.  I hope you feel better soon!    ED Prescriptions     Medication Sig Dispense Auth. Provider   trimethoprim-polymyxin b (POLYTRIM) ophthalmic solution Place 1 drop into the left eye every 4 (four) hours while awake for 7 days. 10 mL Radford Pax, NP      PDMP not reviewed this encounter.   Radford Pax, NP 10/24/23 [phone]

## 2023-11-13 ENCOUNTER — Other Ambulatory Visit (HOSPITAL_COMMUNITY): Payer: Self-pay

## 2023-11-13 ENCOUNTER — Other Ambulatory Visit: Payer: Self-pay | Admitting: Nurse Practitioner

## 2023-11-13 DIAGNOSIS — E559 Vitamin D deficiency, unspecified: Secondary | ICD-10-CM

## 2023-11-16 ENCOUNTER — Other Ambulatory Visit (HOSPITAL_COMMUNITY): Payer: Self-pay

## 2023-11-16 MED ORDER — VITAMIN D (ERGOCALCIFEROL) 1.25 MG (50000 UNIT) PO CAPS
50000.0000 [IU] | ORAL_CAPSULE | ORAL | 0 refills | Status: AC
  Filled 2023-11-16: qty 12, 84d supply, fill #0

## 2024-01-13 ENCOUNTER — Ambulatory Visit: Payer: Commercial Managed Care - PPO | Admitting: Nurse Practitioner

## 2024-01-13 ENCOUNTER — Encounter: Payer: Self-pay | Admitting: Nurse Practitioner

## 2024-01-13 ENCOUNTER — Other Ambulatory Visit (HOSPITAL_COMMUNITY): Payer: Self-pay

## 2024-01-13 VITALS — BP 138/74 | HR 78 | Temp 98.5°F | Ht 67.0 in | Wt 268.4 lb

## 2024-01-13 DIAGNOSIS — Z131 Encounter for screening for diabetes mellitus: Secondary | ICD-10-CM

## 2024-01-13 DIAGNOSIS — E538 Deficiency of other specified B group vitamins: Secondary | ICD-10-CM | POA: Diagnosis not present

## 2024-01-13 DIAGNOSIS — F418 Other specified anxiety disorders: Secondary | ICD-10-CM

## 2024-01-13 DIAGNOSIS — E559 Vitamin D deficiency, unspecified: Secondary | ICD-10-CM

## 2024-01-13 DIAGNOSIS — Z6841 Body Mass Index (BMI) 40.0 and over, adult: Secondary | ICD-10-CM

## 2024-01-13 DIAGNOSIS — Z0000 Encounter for general adult medical examination without abnormal findings: Secondary | ICD-10-CM

## 2024-01-13 DIAGNOSIS — K9049 Malabsorption due to intolerance, not elsewhere classified: Secondary | ICD-10-CM

## 2024-01-13 DIAGNOSIS — Z9884 Bariatric surgery status: Secondary | ICD-10-CM

## 2024-01-13 DIAGNOSIS — E7800 Pure hypercholesterolemia, unspecified: Secondary | ICD-10-CM | POA: Insufficient documentation

## 2024-01-13 LAB — COMPREHENSIVE METABOLIC PANEL WITH GFR
ALT: 22 U/L (ref 0–35)
AST: 21 U/L (ref 0–37)
Albumin: 3.8 g/dL (ref 3.5–5.2)
Alkaline Phosphatase: 97 U/L (ref 39–117)
BUN: 8 mg/dL (ref 6–23)
CO2: 28 meq/L (ref 19–32)
Calcium: 9 mg/dL (ref 8.4–10.5)
Chloride: 102 meq/L (ref 96–112)
Creatinine, Ser: 0.73 mg/dL (ref 0.40–1.20)
GFR: 100.24 mL/min
Glucose, Bld: 85 mg/dL (ref 70–99)
Potassium: 3.8 meq/L (ref 3.5–5.1)
Sodium: 138 meq/L (ref 135–145)
Total Bilirubin: 0.4 mg/dL (ref 0.2–1.2)
Total Protein: 6.7 g/dL (ref 6.0–8.3)

## 2024-01-13 LAB — TSH: TSH: 3.24 u[IU]/mL (ref 0.35–5.50)

## 2024-01-13 LAB — CBC
HCT: 40.9 % (ref 36.0–46.0)
Hemoglobin: 13.3 g/dL (ref 12.0–15.0)
MCHC: 32.5 g/dL (ref 30.0–36.0)
MCV: 94 fL (ref 78.0–100.0)
Platelets: 435 10*3/uL — ABNORMAL HIGH (ref 150.0–400.0)
RBC: 4.35 Mil/uL (ref 3.87–5.11)
RDW: 13.2 % (ref 11.5–15.5)
WBC: 7.7 10*3/uL (ref 4.0–10.5)

## 2024-01-13 LAB — LIPID PANEL
Cholesterol: 170 mg/dL (ref 0–200)
HDL: 52.7 mg/dL
LDL Cholesterol: 93 mg/dL (ref 0–99)
NonHDL: 117.75
Total CHOL/HDL Ratio: 3
Triglycerides: 125 mg/dL (ref 0.0–149.0)
VLDL: 25 mg/dL (ref 0.0–40.0)

## 2024-01-13 LAB — HEMOGLOBIN A1C: Hgb A1c MFr Bld: 5.4 % (ref 4.6–6.5)

## 2024-01-13 LAB — VITAMIN B12: Vitamin B-12: 202 pg/mL — ABNORMAL LOW (ref 211–911)

## 2024-01-13 LAB — VITAMIN D 25 HYDROXY (VIT D DEFICIENCY, FRACTURES): VITD: 24.27 ng/mL — ABNORMAL LOW (ref 30.00–100.00)

## 2024-01-13 MED ORDER — DULOXETINE HCL 30 MG PO CPEP
30.0000 mg | ORAL_CAPSULE | Freq: Every day | ORAL | 1 refills | Status: DC
  Filled 2024-01-13: qty 90, 90d supply, fill #0
  Filled 2024-04-08: qty 90, 90d supply, fill #1

## 2024-01-13 NOTE — Assessment & Plan Note (Addendum)
Referral to allergy specialist for food allergy testing.   I evaluated patient, was consulted regarding treatment, and agree with assessment and plan per Denice Bors RN, FNP Student   [name], DNP, AGNP-C

## 2024-01-13 NOTE — Progress Notes (Addendum)
 Established Patient Office Visit  Subjective   Patient ID: Alexandra Petty, female    DOB: 1981-02-24  Age: 43 y.o. MRN: [id number]  Chief Complaint  Patient presents with   Annual Exam   Medication Problem     HPI  Alexandra Petty is here for complete physical and follow up of chronic conditions.  Depression: Stopped taking Effexor  because she didn't feel like it was helping her. Pt reports that she does still have anxiety. She says initially the Effexor  was working but she was unable to remember to take her evening dose. Denies SI/HI.      01/13/2024    8:04 AM 05/25/2023    2:27 PM 01/14/2023    8:25 AM  PHQ9 SCORE ONLY  PHQ-9 Total Score 7 4 3       01/13/2024    8:04 AM 05/25/2023    2:28 PM 01/14/2023    8:26 AM 05/01/2020    9:20 AM  GAD 7 : Generalized Anxiety Score  Nervous, Anxious, on Edge 2 2 2  0  Control/stop worrying 2 2 1  0  Worry too much - different things 2 1 1  0  Trouble relaxing 2 0 2 0  Restless 1 0 0 0  Easily annoyed or irritable 3 1 1  0  Afraid - awful might happen 1 0 0 0  Total GAD 7 Score 13 6 7  0  Anxiety Difficulty Not difficult at all Not difficult at all Not difficult at all Not difficult at all    [name]: Pt maintaining on B12 SL and D tincture. Pt reports good appetite with no N/V. Has good energy levels.   HTN: Checks BP daily at home. She reports systolic ranges 104-131 and diastolic 73-90. Takes medication as prescribed.    Immunizations: -Tetanus: Completed in 2019 -Influenza: Completed in October 2024 -COVID: Received initial series and boosters. Last 07/28/2022 -Shingles: Not applicable -Pneumonia: Not applicable   Diet: Eats 3 meals a day with rare snacks. Eating anti-inflammatory diet. Made the change because she reports some foods make her feel itchy or flushed. 62 oz of water a day with some coffee; rare soda. Interested in allergy testing.  Exercise: 3 days a week of 45 minutes each. Starting personal training.   Eye exam:  Completes annually. Wears glasses. Dental exam: Completes semi-annually.  Colonoscopy: Not indicated Lung Cancer Screening: Not indicated  PAP: Due with abnormal cells. Referral placed.  Mammo:  Last done 2020 has appointment for next week.     Review of Systems  Constitutional:  Negative for chills, fever and weight loss.  HENT:  Negative for hearing loss and tinnitus.   Eyes:  Negative for blurred vision.  Respiratory:  Negative for shortness of breath.   Cardiovascular:  Negative for chest pain and palpitations.  Gastrointestinal:  Negative for abdominal pain, nausea and vomiting.       BM daily  Musculoskeletal:  Negative for myalgias.  Neurological:  Negative for dizziness and headaches.  Psychiatric/Behavioral:  Positive for depression. Negative for suicidal ideas. The patient is nervous/anxious.       Objective:     BP 138/74   Pulse 78   Temp 98.5 F (36.9 C) (Oral)   Ht 5' 7 (1.702 m)   Wt 121.7 kg   SpO2 96%   BMI 42.04 kg/m  BP Readings from Last 3 Encounters:  01/13/24 138/74  10/24/23 124/84  05/25/23 134/88   Wt Readings from Last 3 Encounters:  01/13/24 121.7 kg  05/25/23  116.6 kg  01/21/23 116.6 kg   SpO2 Readings from Last 3 Encounters:  01/13/24 96%  10/24/23 97%  05/25/23 98%      Physical Exam Constitutional:      Appearance: Normal appearance.  HENT:     Right Ear: Tympanic membrane, ear canal and external ear normal.     Left Ear: Tympanic membrane, ear canal and external ear normal.     Nose: Nose normal.     Mouth/Throat:     Mouth: Mucous membranes are moist.     Pharynx: Oropharynx is clear.  Cardiovascular:     Rate and Rhythm: Normal rate and regular rhythm.     Pulses:          Radial pulses are 2+ on the right side and 2+ on the left side.       Posterior tibial pulses are 2+ on the right side and 2+ on the left side.     Heart sounds: Normal heart sounds. No murmur heard.    No friction rub. No gallop.  Pulmonary:      Effort: Pulmonary effort is normal.     Breath sounds: Normal breath sounds.  Abdominal:     General: Bowel sounds are normal.     Palpations: Abdomen is soft.  Musculoskeletal:     Cervical back: Neck supple.  Lymphadenopathy:     Cervical: No cervical adenopathy.  Neurological:     General: No focal deficit present.     Mental Status: She is alert and oriented to person, place, and time.     Deep Tendon Reflexes:     Reflex Scores:      Bicep reflexes are 2+ on the right side and 2+ on the left side.      Patellar reflexes are 2+ on the right side and 2+ on the left side.    Comments: Strength bilateral upper and lower extremities 5/5  Psychiatric:        Mood and Affect: Mood normal.        Behavior: Behavior normal.      No results found for any visits on 01/13/24.    The 10-year ASCVD risk score (Arnett DK, et al., 2019) is: 1.2%    Assessment & Plan:   Problem List Items Addressed This Visit     Morbid obesity (HCC)   Weight increased from 116.6 kg to 121.7 kg. She started a new diet. Labs pending.       Relevant Orders   Lipid panel   TSH   Hemoglobin A1c   Vitamin D  deficiency   Continued on Vitamin D  tincture.  Labs pending.      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 Labs pending.       Relevant Orders   CBC   Vitamin B12   Comprehensive metabolic panel   VITAMIN D  25 Hydroxy (Vit-D Deficiency, Fractures)   Anxiety with depression   Pt was unable to follow twice daily schedule of Effexor  and therefore stopped taking it altogether. So we will start  Duloxetine  30 mg once daily.       Relevant Medications   DULoxetine  (CYMBALTA ) 30 MG capsule   Other Relevant Orders   TSH   Vitamin B12 deficiency   Continuing B12 supplementation. Labs pending.       Screening for diabetes mellitus - Primary   Labs pending.  Relevant Orders   Hemoglobin A1c   Food intolerance    Referral to allergy specialist for food allergy testing.       Relevant Orders   Ambulatory referral to Allergy   Elevated LDL cholesterol level   Labs pending      Relevant Orders   Lipid panel   Wellness examination   Discussed age-appropriate immunizations and screening exams.  Did review patient's personal, surgical, social, family histories.  Patient is up-to-date on all age-appropriate vaccinations she would like.  Provided referral to Gyn due to hx of abnormal cells on pap. Patient was given information at discharge about preventative healthcare maintenance with anticipatory guidance            Return in about 1 year (around 01/12/2025).    Juliene LOISE Daring, RN

## 2024-01-13 NOTE — Patient Instructions (Signed)
 It was a pleasure to see you today.  You have a referral for gynecology for your cervical cancer screening and a referral to the allergist for food allergy testing.   You have a prescription that was sent to your pharmacy.  We will follow up with you once we have reviewed your labs.  Please schedule a follow up visit for you annual check in one year. We will be happy to see you if you have any other concerns in the meantime.

## 2024-01-13 NOTE — Assessment & Plan Note (Addendum)
 Continued on Vitamin D  tincture.  Labs pending.  I evaluated patient, was consulted regarding treatment, and agree with assessment and plan per Milinda Allen RN, FNP Student   Margarie Shay, DNP, AGNP-C

## 2024-01-13 NOTE — Assessment & Plan Note (Addendum)
 Weight increased from 116.6 kg to 121.7 kg. She started a new diet. Labs pending.  Patient to continue working healthy lifestyle modifications  I evaluated patient, was consulted regarding treatment, and agree with assessment and plan per Juliene Daring RN, FNP Student   Adina Crandall, DNP, AGNP-C

## 2024-01-13 NOTE — Assessment & Plan Note (Addendum)
Continuing B12 supplementation. Labs pending.   I evaluated patient, was consulted regarding treatment, and agree with assessment and plan per Denice Bors RN, FNP Student   [name], DNP, AGNP-C

## 2024-01-13 NOTE — Assessment & Plan Note (Addendum)
 Pt was unable to follow twice daily schedule of Effexor  and therefore stopped taking it altogether. So we will start  Duloxetine  30 mg once daily.   I evaluated patient, was consulted regarding treatment, and agree with assessment and plan per Juliene Daring RN, FNP Student   Adina Crandall, DNP, AGNP-C

## 2024-01-13 NOTE — Assessment & Plan Note (Addendum)
 Discussed age-appropriate immunizations and screening exams.  Did review patient's personal, surgical, social, family histories.  Patient is up-to-date on all age-appropriate vaccinations she would like.  Provided referral to Gyn due to hx of abnormal cells on pap. Patient was given information at discharge about preventative healthcare maintenance with anticipatory guidance      I evaluated patient, was consulted regarding treatment, and agree with assessment and plan per Juliene Daring RN, FNP Student   Adina Crandall, DNP, AGNP-C

## 2024-01-13 NOTE — Assessment & Plan Note (Addendum)
 Continuing B12 and vitamin D  tincture. Labs pending.   I evaluated patient, was consulted regarding treatment, and agree with assessment and plan per Milinda Allen RN, FNP Student   Margarie Shay, DNP, AGNP-C

## 2024-01-13 NOTE — Assessment & Plan Note (Addendum)
 Labs pending.  Continue working healthy lifestyle modifications  I evaluated patient, was consulted regarding treatment, and agree with assessment and plan per Milinda Allen RN, FNP Student   Margarie Shay, DNP, AGNP-C

## 2024-01-13 NOTE — Assessment & Plan Note (Addendum)
 Labs pending.   I evaluated patient, was consulted regarding treatment, and agree with assessment and plan per Denice Bors RN, FNP Student   [name], DNP, AGNP-C

## 2024-01-13 NOTE — Progress Notes (Addendum)
 Established Patient Office Visit  Subjective   Patient ID: Alexandra Petty, female    DOB: [birth year]  Age: 43 y.o. MRN: [id number]  Chief Complaint  Patient presents with   Annual Exam   Medication Problem    HPI   for complete physical and follow up of chronic conditions.   HTN: Patient currently maintained on valsartan -hydrochlorothiazide  320-25 mg daily. She does check her blood pressures at home and does tolerate the medication   B12 def: Patient was given 1000 mcg IM once a month at home in setting of Roux-en-. She is currenlty doing sublingula B12 and a vitmain D tincture   Anxiety and depression: Patient was maintained on venlafaxine  25 mg twice daily. States that she felt like the medication was helping in the beginning but did not continue to work. She was also having trouble remembering the evening dose.    Immunizations: -Tetanus: Completed in 2019 -Influenza: Through employer -Shingles: too young  -Pneumonia: too young   Diet: Fair diet. She is eating 3 meals a day  Exercise: she is exercising 3 tmes a week at 45 mins a time  Eye exam: Completes annually  Dental exam: Completes semi-annually    Colonoscopy: too young, currently average risk   Lung Cancer Screening: Completed in   Pap Smear: was referred to Gyn last year. Last pap 2015 with history of abnormal.  Patient did not make referral new referral needed  Mammogram: Order was placed last year patient has not had it done yet.  Dexa: too young       Review of Systems  Constitutional:  Negative for chills and fever.  Respiratory:  Negative for shortness of breath.   Cardiovascular:  Negative for chest pain and leg swelling.  Gastrointestinal:  Negative for abdominal pain, blood in stool, constipation, diarrhea, nausea and vomiting.  Genitourinary:  Negative for dysuria and hematuria.  Neurological:  Negative for tingling and headaches.  Psychiatric/Behavioral:  Negative for hallucinations and  suicidal ideas.       Objective:     BP 138/74   Pulse 78   Temp 98.5 F (36.9 C) (Oral)   Ht 5' 7 (1.702 m)   Wt 268 lb 6.4 oz (121.7 kg)   SpO2 96%   BMI 42.04 kg/m  BP Readings from Last 3 Encounters:  01/13/24 138/74  10/24/23 124/84  05/25/23 134/88   Wt Readings from Last 3 Encounters:  01/13/24 268 lb 6.4 oz (121.7 kg)  05/25/23 257 lb 2 oz (116.6 kg)  01/21/23 257 lb (116.6 kg)   SpO2 Readings from Last 3 Encounters:  01/13/24 96%  10/24/23 97%  05/25/23 98%      Physical Exam Vitals and nursing note reviewed.  Constitutional:      Appearance: Normal appearance.  HENT:     Right Ear: Tympanic membrane, ear canal and external ear normal.     Left Ear: Tympanic membrane, ear canal and external ear normal.     Mouth/Throat:     Mouth: Mucous membranes are moist.     Pharynx: Oropharynx is clear.  Eyes:     Extraocular Movements: Extraocular movements intact.     Pupils: Pupils are equal, round, and reactive to light.  Cardiovascular:     Rate and Rhythm: Normal rate and regular rhythm.     Pulses: Normal pulses.     Heart sounds: Normal heart sounds.  Pulmonary:     Effort: Pulmonary effort is normal.     Breath sounds: Normal  breath sounds.  Abdominal:     General: Bowel sounds are normal. There is no distension.     Palpations: There is no mass.     Tenderness: There is no abdominal tenderness.     Hernia: No hernia is present.  Musculoskeletal:     Right lower leg: No edema.     Left lower leg: No edema.  Lymphadenopathy:     Cervical: No cervical adenopathy.  Skin:    General: Skin is warm.  Neurological:     General: No focal deficit present.     Mental Status: She is alert.     Deep Tendon Reflexes:     Reflex Scores:      Bicep reflexes are 2+ on the right side and 2+ on the left side.      Patellar reflexes are 2+ on the right side and 2+ on the left side.    Comments: Bilateral upper and lower extremity strength 5/5  Psychiatric:         Mood and Affect: Mood normal.        Behavior: Behavior normal.        Thought Content: Thought content normal.        Judgment: Judgment normal.      No results found for any visits on 01/13/24.    The 10-year ASCVD risk score (Arnett DK, et al., 2019) is: 1.2%    Assessment & Plan:   Problem List Items Addressed This Visit       Other   Morbid obesity (HCC)   Weight increased from 116.6 kg to 121.7 kg. She started a new diet. Labs pending.  Patient to continue working healthy lifestyle modifications  I evaluated patient, was consulted regarding treatment, and agree with assessment and plan per Juliene Daring RN, FNP Student   Adina Crandall, DNP, AGNP-C       Relevant Orders   Lipid panel   TSH   Hemoglobin A1c   Vitamin D  deficiency   Continued on Vitamin D  tincture.  Labs pending.  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 Labs pending.   I evaluated patient, was consulted regarding treatment, and agree with assessment and plan per Juliene Daring RN, FNP Student   Adina Crandall, DNP, AGNP-C       Relevant Orders   CBC   Vitamin B12   Comprehensive metabolic panel   VITAMIN D  25 Hydroxy (Vit-D Deficiency, Fractures)   Anxiety with depression   Pt was unable to follow twice daily schedule of Effexor  and therefore stopped taking it altogether. So we will start  Duloxetine  30 mg once daily.   I evaluated patient, was consulted regarding treatment, and agree with assessment and plan per Juliene Daring RN, FNP Student   Adina Crandall, DNP, AGNP-C       Relevant Medications   DULoxetine  (CYMBALTA ) 30 MG capsule   Other Relevant Orders   TSH   Vitamin B12 deficiency   Continuing B12 supplementation. Labs pending.   I evaluated patient, was consulted  regarding treatment, and agree with assessment and plan per Juliene Daring RN, FNP Student   Adina Crandall, DNP, AGNP-C       Screening for diabetes mellitus - Primary   Labs  pending.   I evaluated patient, was consulted regarding treatment, and agree with assessment and plan per Juliene Daring RN, FNP Student   Adina Crandall, DNP, AGNP-C       Relevant Orders   Hemoglobin A1c   Food intolerance   Referral to allergy specialist for food allergy testing.   I evaluated patient, was consulted regarding treatment, and agree with assessment and plan per Juliene Daring RN, FNP Student   Adina Crandall, DNP, AGNP-C       Relevant Orders   Ambulatory referral to Allergy   Elevated LDL cholesterol level   Labs pending.  Continue working healthy lifestyle modifications  I evaluated patient, was consulted regarding treatment, and agree with assessment and plan per Juliene Daring RN, FNP Student   Adina Crandall, DNP, AGNP-C       Relevant Orders   Lipid panel   Wellness examination   Discussed age-appropriate immunizations and screening exams.  Did review patient's personal, surgical, social, family histories.  Patient is up-to-date on all age-appropriate vaccinations she would like.  Provided referral to Gyn due to hx of abnormal cells on pap. Patient was given information at discharge about preventative healthcare maintenance with anticipatory guidance      I evaluated patient, was consulted regarding treatment, and agree with assessment and plan per Juliene Daring RN, FNP Student   Adina Crandall, DNP, AGNP-C         Return in about 1 year (around 01/12/2025).    Adina Crandall, NP

## 2024-01-15 ENCOUNTER — Encounter: Payer: Self-pay | Admitting: Nurse Practitioner

## 2024-01-22 ENCOUNTER — Ambulatory Visit: Payer: Self-pay | Admitting: Cardiology

## 2024-01-26 ENCOUNTER — Ambulatory Visit: Payer: Commercial Managed Care - PPO | Attending: Cardiology | Admitting: Cardiology

## 2024-01-26 ENCOUNTER — Encounter: Payer: Self-pay | Admitting: Cardiology

## 2024-01-26 ENCOUNTER — Other Ambulatory Visit (HOSPITAL_COMMUNITY): Payer: Self-pay

## 2024-01-26 VITALS — BP 122/78 | HR 80 | Ht 67.0 in | Wt 269.4 lb

## 2024-01-26 DIAGNOSIS — Z8249 Family history of ischemic heart disease and other diseases of the circulatory system: Secondary | ICD-10-CM | POA: Diagnosis not present

## 2024-01-26 DIAGNOSIS — R7989 Other specified abnormal findings of blood chemistry: Secondary | ICD-10-CM | POA: Diagnosis not present

## 2024-01-26 DIAGNOSIS — I10 Essential (primary) hypertension: Secondary | ICD-10-CM

## 2024-01-26 MED ORDER — VALSARTAN-HYDROCHLOROTHIAZIDE 320-25 MG PO TABS
1.0000 | ORAL_TABLET | Freq: Every day | ORAL | 3 refills | Status: AC
  Filled 2024-01-26 – 2024-03-08 (×2): qty 90, 90d supply, fill #0
  Filled 2024-06-06: qty 90, 90d supply, fill #1
  Filled 2024-09-16: qty 90, 90d supply, fill #2
  Filled 2024-12-16: qty 90, 90d supply, fill #3

## 2024-01-26 NOTE — Patient Instructions (Signed)
Medication Instructions:   Your physician recommends that you continue on your current medications as directed. Please refer to the Current Medication list given to you today.  *If you need a refill on your cardiac medications before your next appointment, please call your pharmacy*    Follow-Up: At [facility], you and your health needs are our priority.  As part of our continuing mission to provide you with exceptional heart care, we have created designated Provider Care Teams.  These Care Teams include your primary Cardiologist (physician) and Advanced Practice Providers (APPs -  Physician Assistants and Nurse Practitioners) who all work together to provide you with the care you need, when you need it.  We recommend signing up for the patient portal called "MyChart".  Sign up information is provided on this After Visit Summary.  MyChart is used to connect with patients for Virtual Visits (Telemedicine).  Patients are able to view lab/test results, encounter notes, upcoming appointments, etc.  Non-urgent messages can be sent to your provider as well.   To learn more about what you can do with MyChart, go to [email or web address].    Your next appointment:   1 year(s)  Provider:   Dr. Rosemary Holms

## 2024-01-26 NOTE — Progress Notes (Signed)
  Cardiology Office Note:  .   Date:  01/26/2024  ID:  Alexandra Petty, DOB 1981-10-04, MRN [id number] PCP: Eden Emms, NP  [facility] Providers Cardiologist:  Truett Mainland, MD PCP: Eden Emms, NP  Chief Complaint  Patient presents with   Essential hypertension   Follow-up      History of Present Illness: .    Alexandra Petty is a 43 y.o. female with hypertension, family h/o early CAD   Patient is doing well, she does not have any completely.  She is walking 45 minutes 3-4 times a week on walking track.  Blood pressure is very well-controlled.  Reviewed recent lab results with the patient, details below.  Patient asks about her mildly elevated platelet count.  It appears that it has been as high as 435 as far back as 2015.  She reports no clotting issues that she is aware of.   Vitals:   01/26/24 0806  BP: 122/78  Pulse: 80  SpO2: 98%     ROS:  Review of Systems  Cardiovascular:  Negative for chest pain, dyspnea on exertion, leg swelling, palpitations and syncope.     Studies Reviewed: Marland Kitchen        EKG 01/26/2024: Normal sinus rhythm Normal ECG When compared with ECG of 18-Oct-2014 09:27, No significant change was found    Independently interpreted 01/2024: Chol 170, TG 125, HDL 52, LDL 93 HbA1C 5.4% Hb 13.3 Cr 0.73    Physical Exam:   Physical Exam Vitals and nursing note reviewed.  Constitutional:      General: She is not in acute distress. Neck:     Vascular: No JVD.  Cardiovascular:     Rate and Rhythm: Normal rate and regular rhythm.     Heart sounds: Normal heart sounds. No murmur heard. Pulmonary:     Effort: Pulmonary effort is normal.     Breath sounds: Normal breath sounds. No wheezing or rales.  Musculoskeletal:     Right lower leg: No edema.     Left lower leg: No edema.      VISIT DIAGNOSES:   ICD-10-CM   1. Essential hypertension  I10 EKG 12-Lead       ASSESSMENT AND PLAN: .    Alexandra Petty is a 43 y.o.  female with hypertension, family h/o early CAD   Hypertension: Very well-controlled on valsartan-hydrochlorothiazide 320-25 mg daily. Refilled the same.   Continue regular walking.     Family history of early coronary artery disease: Calcium score 0.  Lipids improving, LDL 93. Continue regular heart healthy diet and exercise.  Elevated platelet count: Platelet count 435, has been in 400s as far as back as 2015, with no known clotting issues. This is not clinically manifested, reasonable to discuss with PCP if hematology referral is warranted.        Meds ordered this encounter  Medications   valsartan-hydrochlorothiazide (DIOVAN-HCT) 320-25 MG tablet    Sig: Take 1 tablet by mouth daily.    Dispense:  90 tablet    Refill:  3     F/u in 1 year  Signed, Elder Negus, MD

## 2024-01-28 ENCOUNTER — Encounter: Payer: Self-pay | Admitting: *Deleted

## 2024-02-05 ENCOUNTER — Other Ambulatory Visit (HOSPITAL_COMMUNITY): Payer: Self-pay

## 2024-02-18 ENCOUNTER — Telehealth: Payer: Self-pay | Admitting: Nurse Practitioner

## 2024-02-18 NOTE — Telephone Encounter (Signed)
 Left message to return call to our office.

## 2024-02-18 NOTE — Telephone Encounter (Signed)
-----   Message from [facility] sent at 01/13/2024  8:55 AM EST ----- Regarding: Anxiety and depression Check and see how duloxetine is doing

## 2024-02-18 NOTE — Telephone Encounter (Signed)
 Can we call and see how she is doing on the duloxetine (cymbalta)

## 2024-02-24 NOTE — Telephone Encounter (Signed)
 Contacted pt.   She said she is doing well on Cymbalta. States that she has not had the full effects of the medication yet but is doing fine. No further questions or concerns.

## 2024-03-08 ENCOUNTER — Other Ambulatory Visit (HOSPITAL_COMMUNITY): Payer: Self-pay

## 2024-03-08 ENCOUNTER — Other Ambulatory Visit: Payer: Self-pay | Admitting: Nurse Practitioner

## 2024-03-08 DIAGNOSIS — E559 Vitamin D deficiency, unspecified: Secondary | ICD-10-CM

## 2024-03-09 ENCOUNTER — Other Ambulatory Visit (HOSPITAL_COMMUNITY): Payer: Self-pay

## 2024-03-11 ENCOUNTER — Other Ambulatory Visit (HOSPITAL_COMMUNITY): Payer: Self-pay

## 2024-03-11 ENCOUNTER — Encounter (HOSPITAL_COMMUNITY): Payer: Self-pay

## 2024-04-11 ENCOUNTER — Other Ambulatory Visit (HOSPITAL_COMMUNITY): Payer: Self-pay

## 2024-04-11 DIAGNOSIS — J3089 Other allergic rhinitis: Secondary | ICD-10-CM | POA: Diagnosis not present

## 2024-04-11 DIAGNOSIS — R21 Rash and other nonspecific skin eruption: Secondary | ICD-10-CM | POA: Diagnosis not present

## 2024-04-11 MED ORDER — MOMETASONE FUROATE 0.1 % EX OINT
1.0000 | TOPICAL_OINTMENT | Freq: Two times a day (BID) | CUTANEOUS | 3 refills | Status: AC | PRN
Start: 2024-04-11 — End: ?
  Filled 2024-04-11: qty 60, 30d supply, fill #0

## 2024-07-26 ENCOUNTER — Other Ambulatory Visit (HOSPITAL_COMMUNITY): Payer: Self-pay

## 2024-07-26 ENCOUNTER — Other Ambulatory Visit: Payer: Self-pay | Admitting: Nurse Practitioner

## 2024-07-26 DIAGNOSIS — F418 Other specified anxiety disorders: Secondary | ICD-10-CM

## 2024-07-26 MED ORDER — DULOXETINE HCL 30 MG PO CPEP
30.0000 mg | ORAL_CAPSULE | Freq: Every day | ORAL | 1 refills | Status: AC
  Filled 2024-07-26: qty 90, 90d supply, fill #0
  Filled 2024-12-02: qty 90, 90d supply, fill #1

## 2024-07-29 ENCOUNTER — Other Ambulatory Visit (HOSPITAL_COMMUNITY)
Admission: RE | Admit: 2024-07-29 | Discharge: 2024-07-29 | Disposition: A | Discharge status: Home / Self Care | Source: Ambulatory Visit | Attending: Obstetrics and Gynecology | Admitting: Obstetrics and Gynecology

## 2024-07-29 ENCOUNTER — Ambulatory Visit: Admitting: Obstetrics and Gynecology

## 2024-07-29 ENCOUNTER — Encounter: Payer: Self-pay | Admitting: Obstetrics and Gynecology

## 2024-07-29 VITALS — BP 137/90 | Ht 69.0 in | Wt 285.0 lb

## 2024-07-29 DIAGNOSIS — Z01419 Encounter for gynecological examination (general) (routine) without abnormal findings: Secondary | ICD-10-CM | POA: Insufficient documentation

## 2024-07-29 DIAGNOSIS — Z124 Encounter for screening for malignant neoplasm of cervix: Secondary | ICD-10-CM

## 2024-07-29 DIAGNOSIS — N9489 Other specified conditions associated with female genital organs and menstrual cycle: Secondary | ICD-10-CM | POA: Insufficient documentation

## 2024-07-29 NOTE — Progress Notes (Signed)
 ANNUAL EXAM Patient name: Alexandra Petty MRN [id number]  Date of birth: 1981/05/23 Chief Complaint:   NEW PATIENT/GYN (ANNUAL)  History of Present Illness:   Alexandra Petty is a 43 y.o. G1P0010 being seen today for a routine annual exam.  Current complaints: more frequent and shorter menses  No hot flashes, no vaginal dryness  Menstrual concerns? Yes   Breast or nipple changes? No last was 2020 Contraception use? Yes condoms Sexually active? Yes female partner; no pain with intercourse  Declines STI testing. HIV tsting with last physical exam. Having vaginal irritation currently that started recently w/ a knot. Was at the beach for over a week and not sure if from the beach and not sure from being in the ocean for prolonged period.  Last 4 months the menses hve been getting shorter. 19-22 day cycles. Uses pads due to pain with tampons.  Mom had menopauseal changes at 48    Patient's last menstrual period was 07/21/2024.   The pregnancy intention screening data noted above was reviewed. Potential methods of contraception were discussed. The patient elected to proceed with No data recorded.   Last pap No results found for: DIAGPAP, HPVHIGH, ADEQPAP   H/O abnormal pap: yes in her 74s; last one was fine Last mammogram: 10/2019 BIRADS 1.  Last colonoscopy: N/A.      07/29/2024   10:03 AM 01/13/2024    8:04 AM 05/25/2023    2:27 PM 01/14/2023    8:25 AM 01/30/2021    8:43 AM  Depression screen PHQ 2/9  Decreased Interest 2 1 1 1  0  Down, Depressed, Hopeless 2 0 1 1 0  PHQ - 2 Score 4 1 2 2  0  Altered sleeping 0 0 0 0   Tired, decreased energy 1 2 0 0   Change in appetite 0 0 0 0   Feeling bad or failure about yourself  0 2 1 1    Trouble concentrating 0 2 0 0   Moving slowly or fidgety/restless 0 0 0 0   Suicidal thoughts 0 0 1 0   PHQ-9 Score 5 7 4 3    Difficult doing work/chores  Not difficult at all Not difficult at all Not difficult at all         07/29/2024    10:03 AM 01/13/2024    8:04 AM 05/25/2023    2:28 PM 01/14/2023    8:26 AM  GAD 7 : Generalized Anxiety Score  Nervous, Anxious, on Edge 2 2 2 2   Control/stop worrying 1 2 2 1   Worry too much - different things 1 2 1 1   Trouble relaxing 1 2 0 2  Restless 0 1 0 0  Easily annoyed or irritable 1 3 1 1   Afraid - awful might happen 1 1 0 0  Total GAD 7 Score 7 13 6 7   Anxiety Difficulty  Not difficult at all Not difficult at all Not difficult at all     Review of Systems:   Pertinent items are noted in HPI Denies any headaches, blurred vision, fatigue, shortness of breath, chest pain, abdominal pain, abnormal vaginal discharge/itching/odor/irritation, problems with periods, bowel movements, urination, or intercourse unless otherwise stated above. Pertinent History Reviewed:  Reviewed past medical,surgical, social and family history.  Reviewed problem list, medications and allergies. Physical Assessment:   Vitals:   07/29/24 0958  BP: (!) 137/90  Weight: 285 lb (129.3 kg)  Height: 5' 9 (1.753 m)  Body mass index is 42.09 kg/m.  Physical Examination:   General appearance - well appearing, and in no distress  Mental status - alert, oriented to person, place, and time  Psych:  She has a normal mood and affect  Skin - warm and dry, normal color, no suspicious lesions noted  Chest - effort normal, all lung fields clear to auscultation bilaterally  Heart - normal rate and regular rhythm  Breasts - breasts appear normal, no suspicious masses, no skin or nipple changes or  axillary nodes  Abdomen - soft, nontender, nondistended, no masses or organomegaly  Pelvic -  VULVA: normal appearing mons and right labia majora and minora; left side architecturally normal but enlarged and mildly indurated, 1mm opening in inner labia minora just lateral to induratedlabia minora with orange serous discharge, able to express additioanl discharge with internal palpaiton  VAGINA: normal appearing  vagina with normal color and discharge, no lesions   CERVIX: normal appearing cervix without discharge or lesions, no CMT  Thin prep pap is done with HR HPV cotesting  UTERUS: uterus is felt to be normal size, shape, consistency and nontender   ADNEXA: No adnexal masses or tenderness noted.  Extremities:  No swelling or varicosities noted  Chaperone present for exam  No results found for this or any previous visit (from the past 24 hours).    Assessment & Plan:   1. Well woman exam with routine gynecological exam (Primary) - Cervical cancer screening: Discussed guidelines. Pap with HPV collected - STD Testing: declines - Birth Control: Discussed options and their risks, benefits and common side effects; discussed VTE with estrogen containing options. Desires: Condoms - Breast Health: Encouraged self breast awareness/SBE. Teaching provided. Discussed limits of clinical breast exam for detecting breast cancer. Rx given for MXR - F/U 12 months and prn  - Cytology - PAP( [place]) - MM 3D SCREENING MAMMOGRAM BILATERAL BREAST; Future - Ambulatory referral to Integrated Behavioral Health  2. Screening for cervical cancer Pap collected - Cytology - PAP( [place])  3. Labial swelling Unclear etiology. Draining spontaneously on examination. Vaginitis swab and wound culture collected. Non-erythematous appearing labia, may be inflammatory draining vs truly infectious but will follow up if there is any growth. Reviewed changes in labia or systemically that should prompt re-evaluation.  - Cervicovaginal ancillary only( [place]) - Wound culture   Orders Placed This Encounter  Procedures   Wound culture   MM 3D SCREENING MAMMOGRAM BILATERAL BREAST   Ambulatory referral to Integrated Behavioral Health    Meds: No orders of the defined types were placed in this encounter.   Follow-up: Return if symptoms worsen or fail to improve, for Annual GYN.  Carter Quarry,  MD 07/29/2024 10:41 AM

## 2024-07-29 NOTE — Progress Notes (Signed)
 43 y.o. New GYN presents for AEX/PAP/STD screening.  Pt states she has not had exam since 2015.  Pt states cycles have been closer together- about 19 days apart. Pt also complains of vaginal irritation.

## 2024-08-01 LAB — CYTOLOGY - PAP
Adequacy: ABSENT
Comment: NEGATIVE
Diagnosis: NEGATIVE
High risk HPV: NEGATIVE

## 2024-08-02 LAB — CERVICOVAGINAL ANCILLARY ONLY
Bacterial Vaginitis (gardnerella): POSITIVE — AB
Candida Glabrata: NEGATIVE
Candida Vaginitis: NEGATIVE
Comment: NEGATIVE
Comment: NEGATIVE
Comment: NEGATIVE

## 2024-08-02 NOTE — Unmapped External Note (Signed)
 Special OR Call 2 completed to review [facility] Program requirements. - No answer, LVMM with CB info.

## 2024-08-03 ENCOUNTER — Ambulatory Visit: Payer: Self-pay | Admitting: Obstetrics and Gynecology

## 2024-08-03 DIAGNOSIS — N764 Abscess of vulva: Secondary | ICD-10-CM

## 2024-08-03 DIAGNOSIS — N9489 Other specified conditions associated with female genital organs and menstrual cycle: Secondary | ICD-10-CM

## 2024-08-03 LAB — WOUND CULTURE: Organism ID, Bacteria: NONE SEEN

## 2024-08-03 NOTE — Telephone Encounter (Signed)
 Called, no answer, LVM.

## 2024-08-05 ENCOUNTER — Other Ambulatory Visit (HOSPITAL_COMMUNITY): Payer: Self-pay

## 2024-08-05 MED ORDER — SULFAMETHOXAZOLE-TRIMETHOPRIM 800-160 MG PO TABS
1.0000 | ORAL_TABLET | Freq: Two times a day (BID) | ORAL | 0 refills | Status: AC
  Filled 2024-08-05: qty 10, 5d supply, fill #0

## 2024-08-25 ENCOUNTER — Ambulatory Visit

## 2024-09-01 ENCOUNTER — Ambulatory Visit

## 2024-09-20 ENCOUNTER — Other Ambulatory Visit (HOSPITAL_COMMUNITY): Payer: Self-pay

## 2024-09-20 MED ORDER — FLUZONE 0.5 ML IM SUSY
0.5000 mL | PREFILLED_SYRINGE | Freq: Once | INTRAMUSCULAR | 0 refills | Status: AC
  Filled 2024-09-20: qty 0.5, 1d supply, fill #0

## 2024-10-11 ENCOUNTER — Ambulatory Visit (INDEPENDENT_AMBULATORY_CARE_PROVIDER_SITE_OTHER): Payer: Self-pay | Admitting: Licensed Clinical Social Worker

## 2024-10-11 DIAGNOSIS — F4323 Adjustment disorder with mixed anxiety and depressed mood: Secondary | ICD-10-CM | POA: Diagnosis not present

## 2024-10-11 NOTE — BH Specialist Note (Unsigned)
 Integrated Behavioral Health via Telemedicine Visit  10/11/2024 Alexandra Petty [id number]  Number of Integrated Behavioral Health Clinician visits: 1- Initial Visit  Session Start time: 0845   Session End time: 0950  Total time in minutes: 65    Referring Provider: Jeralyn, MD Patient/Family location: At [facility] Provider location: Remote Office All persons participating in visit: Patient and [facility] Types of Service: Individual psychotherapy and Video visit  I connected with Alexandra Petty and/or Alexandra Petty's patient via  Telephone or Engineer, Civil (consulting)  (Video is Surveyor, mining) and verified that I am speaking with the correct person using two identifiers. Discussed confidentiality: Yes   I discussed the limitations of telemedicine and the availability of in person appointments.  Discussed there is a possibility of technology failure and discussed alternative modes of communication if that failure occurs.  I discussed that engaging in this telemedicine visit, they consent to the provision of behavioral healthcare and the services will be billed under their insurance.  Patient and/or legal guardian expressed understanding and consented to Telemedicine visit: Yes   Presenting Concerns: Patient and/or family reports the following symptoms/concerns: Increased and ongoing anxiety symptoms.  Duration of problem: Month; Severity of problem: moderate  Patient and/or Family's Strengths/Protective Factors: Social and Emotional competence, Concrete supports in place (healthy food, safe environments, etc.), and Physical Health (exercise, healthy diet, medication compliance, etc.)  Goals Addressed: Patient will:  Reduce symptoms of: anxiety and depression   Increase knowledge and/or ability of: coping skills, healthy habits, and self-management skills   Demonstrate ability to: Increase healthy adjustment to current life circumstances and Increase adequate  support systems for patient/family  Progress towards Goals: Ongoing    Interventions: Interventions utilized:  Mindfulness or Management Consultant, Supportive Counseling, Psychoeducation and/or Health Education, Communication Skills, and Supportive Reflection Standardized Assessments completed: Not Needed    Patient and/or Family Response: bad anxietry.. ordering groceries and stopped goig to the grocery store..   Want to do it but can't seem to do it. Feels like a task..  Growing more and more irritable.Alexandra Petty gets angry when things are not where they used to be.   Signed up for heart activities from 5-7p  Withdrawn from her friends.. thought of going to lunch with someone is exhausting to her. Made her a POA, lived at her home for 33 days.   No children. Her and her husband. Sister recently moved in with her.   Sister movng in.. can't say no to her.. not following her rules.  Having to take care of husband's aunt for 33 days and did not return home. Too much responsiblities. Feel stuck.Alexandra Petty aunt wants her to care for her.   Great relationship with husband but doesn't have sex.   Clinical Assessment/Diagnosis  Adjustment disorder with mixed anxiety and depressed mood    Assessment: Patient currently experiencing ***.   Patient may benefit from continued support of integrated behavioral health services.  Plan: Follow up with behavioral health clinician on : 10/18/2024 Behavioral recommendations: *** Referral(s): Integrated [employer] (In Clinic)  I discussed the assessment and treatment plan with the patient and/or parent/guardian. They were provided an opportunity to ask questions and all were answered. They agreed with the plan and demonstrated an understanding of the instructions.   They were advised to call back or seek an in-person evaluation if the symptoms worsen or if the condition fails to improve as anticipated.  Jozef Eisenbeis LITTIE Seats, LCSWA

## 2024-10-18 ENCOUNTER — Ambulatory Visit (INDEPENDENT_AMBULATORY_CARE_PROVIDER_SITE_OTHER): Admitting: Licensed Clinical Social Worker

## 2024-10-18 DIAGNOSIS — F4323 Adjustment disorder with mixed anxiety and depressed mood: Secondary | ICD-10-CM | POA: Diagnosis not present

## 2024-10-18 NOTE — BH Specialist Note (Unsigned)
 Integrated Behavioral Health via Telemedicine Visit  10/20/2024 Soliana Kitko [id number]  Number of Integrated Behavioral Health Clinician visits: 2- Second Visit  Session Start time: 0845   Session End time: 0921  Total time in minutes: 36    Referring Provider: Jeralyn, MD Patient/Family location: At home [facility] Provider location: Remote Office All persons participating in visit: Patient and [facility] Types of Service: Individual psychotherapy and Video visit  I connected with Alan Pho and/or Alan Cortez's patient via  Telephone or Engineer, Civil (consulting)  (Video is Surveyor, mining) and verified that I am speaking with the correct person using two identifiers. Discussed confidentiality: Yes   I discussed the limitations of telemedicine and the availability of in person appointments.  Discussed there is a possibility of technology failure and discussed alternative modes of communication if that failure occurs.  I discussed that engaging in this telemedicine visit, they consent to the provision of behavioral healthcare and the services will be billed under their insurance.  Patient and/or legal guardian expressed understanding and consented to Telemedicine visit: Yes   Presenting Concerns: Patient and/or family reports the following symptoms/concerns: improvements with mood and anxiety symptoms.  Duration of problem: Months; Severity of problem: moderate  Patient and/or Family's Strengths/Protective Factors: Social and Emotional competence, Concrete supports in place (healthy food, safe environments, etc.), and Physical Health (exercise, healthy diet, medication compliance, etc.)  Goals Addressed: Patient will:  Reduce symptoms of: anxiety and depression   Increase knowledge and/or ability of: coping skills, healthy habits, and self-management skills   Demonstrate ability to: Increase healthy adjustment to current life circumstances and Increase adequate  support systems for patient/family  Progress towards Goals: Ongoing    Interventions: Interventions utilized:  Mindfulness or Relaxation Training, Supportive Counseling, Psychoeducation and/or Health Education, and Supportive Reflection Standardized Assessments completed: Not Needed    Patient and/or Family Response: Patient was present for today's virtual session. She reported that the previous session was helpful and shared feeling confident after successfully having a difficult but positive conversation with her sister. Patient noted that her sister has been staying with a friend for the past week, which has allowed her and her husband to spend more quality time together and improve communication. She expressed that having her home to herself again has restored a sense of peace and normalcy. Patient also reported improvement in her relationship with her aunt-in-law, noting that she has set clear boundaries and communicated her expectations regarding the support she is able to provide and the time she can commit. Patient agreed to continue prioritizing quality time with her husband and being intentional about meeting his emotional needs and love language.   Clinical Assessment/Diagnosis  Adjustment disorder with mixed anxiety and depressed mood    Assessment: Patient currently experiencing improved mood, increased confidence in setting boundaries, and greater satisfaction in her relationships with both her husband and extended family. She reports feeling more balanced and at peace in her home environment..   Patient may benefit from continued support of integrated behavioral health services.  Plan: Follow up with behavioral health clinician on : 10/25/2024 Behavioral recommendations: patient to continue practicing healthy communication and boundary-setting with family members, as well as maintaining intentional quality time and emotional connection with her husband. Continued engagement  in therapy is encouraged to reinforce these positive behavioral patterns and support ongoing relationship and emotional stability. Referral(s): Integrated [employer] (In Clinic)  I discussed the assessment and treatment plan with the patient and/or parent/guardian. They were provided an  opportunity to ask questions and all were answered. They agreed with the plan and demonstrated an understanding of the instructions.   They were advised to call back or seek an in-person evaluation if the symptoms worsen or if the condition fails to improve as anticipated.  Krislyn Donnan LITTIE Seats, [place]

## 2024-10-25 ENCOUNTER — Encounter: Admitting: Licensed Clinical Social Worker

## 2024-11-18 ENCOUNTER — Encounter: Payer: Self-pay | Admitting: Nurse Practitioner

## 2024-12-02 ENCOUNTER — Other Ambulatory Visit (HOSPITAL_COMMUNITY): Payer: Self-pay

## 2024-12-02 MED ORDER — MUPIROCIN 2 % EX OINT
1.0000 | TOPICAL_OINTMENT | Freq: Three times a day (TID) | CUTANEOUS | 0 refills | Status: AC
  Filled 2024-12-02: qty 22, 30d supply, fill #0
